# Patient Record
Sex: Female | Born: 1995 | Race: Black or African American | Hispanic: No | Marital: Single | Smoking: Never smoker
Health system: Southern US, Community
[De-identification: ages and names within clinical notes are randomized; demographics above are authoritative.]

## PROBLEM LIST (undated history)

## (undated) ENCOUNTER — Ambulatory Visit (HOSPITAL_COMMUNITY): Admission: EM

## (undated) DIAGNOSIS — D649 Anemia, unspecified: Secondary | ICD-10-CM

## (undated) HISTORY — DX: Anemia, unspecified: D64.9

---
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
## 2017-10-25 ENCOUNTER — Encounter (HOSPITAL_COMMUNITY): Payer: Self-pay

## 2017-10-25 ENCOUNTER — Ambulatory Visit (HOSPITAL_COMMUNITY)
Admission: EM | Admit: 2017-10-25 | Discharge: 2017-10-25 | Disposition: A | Discharge status: Home / Self Care | Payer: Self-pay | Attending: Family Medicine | Admitting: Family Medicine

## 2017-10-25 ENCOUNTER — Other Ambulatory Visit: Payer: Self-pay

## 2017-10-25 DIAGNOSIS — H9201 Otalgia, right ear: Secondary | ICD-10-CM | POA: Insufficient documentation

## 2017-10-25 DIAGNOSIS — J039 Acute tonsillitis, unspecified: Secondary | ICD-10-CM

## 2017-10-25 DIAGNOSIS — Z79899 Other long term (current) drug therapy: Secondary | ICD-10-CM | POA: Insufficient documentation

## 2017-10-25 LAB — POCT RAPID STREP A: STREPTOCOCCUS, GROUP A SCREEN (DIRECT): NEGATIVE

## 2017-10-25 MED ORDER — IBUPROFEN 100 MG/5ML PO SUSP: 800.0000 mg | Freq: Three times a day (TID) | ORAL | 0 refills | Status: DC | PRN

## 2017-10-25 MED ORDER — AMOXICILLIN 500 MG PO CAPS: 500.0000 mg | ORAL_CAPSULE | Freq: Two times a day (BID) | ORAL | 0 refills | Status: AC

## 2017-10-25 NOTE — Discharge Instructions (Signed)
Throat lozenges, gargles, chloraseptic spray, warm teas, popsicles etc to help with throat pain.   Complete course of antibiotics.   Tylenol and/or ibuprofen as needed for pain or fevers.   If symptoms worsen or do not improve in the next week to return to be seen or to follow up with your PCP.

## 2017-10-25 NOTE — ED Provider Notes (Signed)
[place]    CSN: [id number] Arrival date & time: 10/25/17  1545     History   Chief Complaint Chief Complaint  Patient presents with  . Sore Throat    HPI Michele Fox is a 22 y.o. female.   Michele Fox presents with her mother with complaints of sore throat, worse on right side. Started 9/29 and has not improved. Subjective fevers. Causes right ear pain. Some cough and congestion as well but primarily sore throat. No known ill contacts. No gi/gu complaints. Has been unable to swallow any pills so hasn't taken any medications for symptoms. No rash. Denies any previous similar. Without contributing medical history.      ROS per HPI.      History reviewed. No pertinent past medical history.  There are no active problems to display for this patient.   History reviewed. No pertinent surgical history.  OB History   None      Home Medications    Prior to Admission medications   Medication Sig Start Date End Date Taking? Authorizing Provider  acetaminophen (TYLENOL) 500 MG tablet Take 500 mg by mouth every 4 (four) hours as needed.    [provider]  amoxicillin (AMOXIL) 500 MG capsule Take 1 capsule (500 mg total) by mouth 2 (two) times daily for 10 days. 10/25/17 11/04/17  Zigmund Gottron, NP  ibuprofen (ADVIL,MOTRIN) 100 MG/5ML suspension Take 40 mLs (800 mg total) by mouth every 8 (eight) hours as needed for fever or mild pain. 10/25/17   Zigmund Gottron, NP  OVER THE COUNTER MEDICATION Take 10 mL/mL by mouth 2 (two) times daily. OTC.Cold & Fever Syrup    [provider]    Family History History reviewed. No pertinent family history.  Social History Social History   Tobacco Use  . Smoking status: Never Smoker  . Smokeless tobacco: Never Used  Substance Use Topics  . Alcohol use: Not on file  . Drug use: Not on file     Allergies   Other   Review of Systems Review of Systems   Physical Exam Triage Vital  Signs ED Triage Vitals  Enc Vitals Group     BP 10/25/17 1602 129/80     Pulse Rate 10/25/17 1602 81     Resp 10/25/17 1602 (!) 81     Temp 10/25/17 1602 99.6 F (37.6 C)     Temp src --      SpO2 10/25/17 1602 100 %     Weight 10/25/17 1556 258 lb (117 kg)     Height --      Head Circumference --      Peak Flow --      Pain Score --      Pain Loc --      Pain Edu? --      Excl. in [place]? --    No data found.  Updated Vital Signs BP 129/80 (BP Location: Right Arm)   Pulse 81   Temp 99.6 F (37.6 C)   Resp (!) 81   Wt 258 lb (117 kg)   LMP 10/08/2017   SpO2 100%    Physical Exam  Constitutional: She is oriented to person, place, and time. She appears well-developed and well-nourished. No distress.  HENT:  Head: Normocephalic and atraumatic.  Right Ear: Tympanic membrane, external ear and ear canal normal.  Left Ear: Tympanic membrane, external ear and ear canal normal.  Nose: Nose normal.  Mouth/Throat: Uvula is midline, oropharynx  is clear and moist and mucous membranes are normal. Tonsils are 3+ on the right. Tonsils are 2+ on the left. Tonsillar exudate.  Eyes: Pupils are equal, round, and reactive to light. Conjunctivae and EOM are normal.  Cardiovascular: Normal rate, regular rhythm and normal heart sounds.  Pulmonary/Chest: Effort normal and breath sounds normal.  Lymphadenopathy:    She has cervical adenopathy.  Neurological: She is alert and oriented to person, place, and time.  Skin: Skin is warm and dry.     UC Treatments / Results  Labs (all labs ordered are listed, but only abnormal results are displayed) Labs Reviewed  CULTURE, GROUP A STREP [facility])  POCT RAPID STREP A    EKG None  Radiology No results found.  Procedures Procedures (including critical care time)  Medications Ordered in UC Medications - No data to display  Initial Impression / Assessment and Plan / UC Course  I have reviewed the triage vital signs and the nursing  notes.  Pertinent labs & imaging results that were available during my care of the patient were reviewed by me and considered in my medical decision making (see chart for details).     Negative rapid strep. Temp 99.6 here in clinic, impressive tonsillar exam with adenopathy as well, will treat tonsillitis with amoxacillin at this time. If symptoms worsen or do not improve in the next week to return to be seen or to follow up with PCP.  Patient verbalized understanding and agreeable to plan.   Final Clinical Impressions(s) / UC Diagnoses   Final diagnoses:  Tonsillitis     Discharge Instructions     Throat lozenges, gargles, chloraseptic spray, warm teas, popsicles etc to help with throat pain.   Complete course of antibiotics.   Tylenol and/or ibuprofen as needed for pain or fevers.   If symptoms worsen or do not improve in the next week to return to be seen or to follow up with your PCP.     ED Prescriptions    Medication Sig Dispense Auth. Provider   amoxicillin (AMOXIL) 500 MG capsule Take 1 capsule (500 mg total) by mouth 2 (two) times daily for 10 days. 20 capsule Augusto Gamble B, NP   ibuprofen (ADVIL,MOTRIN) 100 MG/5ML suspension Take 40 mLs (800 mg total) by mouth every 8 (eight) hours as needed for fever or mild pain. 473 mL Augusto Gamble B, NP     Controlled Substance Prescriptions [place] Controlled Substance Registry consulted? Not Applicable   Zigmund Gottron, NP 10/25/17 1646

## 2017-10-25 NOTE — ED Triage Notes (Signed)
Pt states she has a sore throat x 5 days

## 2017-10-28 LAB — CULTURE, GROUP A STREP (THRC)

## 2017-11-30 ENCOUNTER — Telehealth (HOSPITAL_COMMUNITY): Payer: Self-pay | Admitting: Emergency Medicine

## 2017-11-30 NOTE — Telephone Encounter (Signed)
Pt calling asking for refill of amoxicillin, stsates her tonsils are swollen again. Per traci, will send in prednisone trial and if that doesn't work pt needs eval with ENT or ER.

## 2018-01-23 NOTE — L&D Delivery Note (Addendum)
Delivery Note At 12:14 PM a viable female was delivered via Vaginal, Spontaneous (Presentation: Vertex; ROA).  APGAR: 6, 8; weight see delivery summary.   Placenta status: Spontaneous, intact with the following complications: Cord: Delayed cord clamp, 3 vessels with the following complications: none.   Anesthesia:  Epidural Episiotomy: None Lacerations: 2nd degree;Perineal Suture Repair: 3.0 vicryl x 2 Est. Blood Loss (mL):  8144  Complications:  PPH: Pitocin was given after delivery of the infant. Uterine prolapse with placental expulsion (Dr. Arlina Robes called to the room). Submucosal fibroid found on exam. Bimanual massage applied. Fundal massage performed. Placental tissue x2 removed. Perineal laceration was repaired. Methergine 0.2 mg/ml, TXA, 827mcg rectal cytotec given. Patient was hemostatic 30 minutes after delivery of the placenta. CBC and DIC panel was collected during care.  Mom to postpartum.  Baby to Couplet care / Skin to Skin.  Gerlene Fee, DO Family Medicine Resident 08/12/2018, 2:00 PM  OB Attending CTSP following delivery by Derrill Memo. Prolapse uterine fibroid with probable uterine inversion. Had been replaced by Derrill Memo. Uterine exploration noted @ 9 cm submucosal fibroid with fragments of membranes. Fragments removed. Pt given Cytotec rectal, TXA and Methergine. Good response to bleeding noted. Uterus firm. Pt did become hypotensive with emesis. Anesthesia notified. Pt treated with IV fluids and Albumin. Good response to these measures noted. Labs drawn as noted above. Mother and infant recovering in room together.  Arlina Robes, MD

## 2018-02-18 ENCOUNTER — Encounter (HOSPITAL_COMMUNITY): Payer: Self-pay

## 2018-02-18 ENCOUNTER — Other Ambulatory Visit (HOSPITAL_COMMUNITY): Payer: Self-pay | Admitting: Family Medicine

## 2018-02-18 DIAGNOSIS — Z369 Encounter for antenatal screening, unspecified: Secondary | ICD-10-CM

## 2018-02-19 ENCOUNTER — Ambulatory Visit (HOSPITAL_COMMUNITY)
Admission: RE | Admit: 2018-02-19 | Discharge: 2018-02-19 | Disposition: A | Discharge status: Home / Self Care | Payer: Medicaid Other | Source: Ambulatory Visit | Attending: Family | Admitting: Family

## 2018-02-19 ENCOUNTER — Encounter (HOSPITAL_COMMUNITY): Payer: Self-pay

## 2018-02-19 ENCOUNTER — Other Ambulatory Visit (HOSPITAL_COMMUNITY): Payer: Self-pay

## 2018-08-12 ENCOUNTER — Inpatient Hospital Stay (HOSPITAL_COMMUNITY): Payer: Medicaid Other

## 2018-08-12 ENCOUNTER — Other Ambulatory Visit: Payer: Self-pay

## 2018-08-12 ENCOUNTER — Inpatient Hospital Stay (HOSPITAL_COMMUNITY)
Admission: AD | Admit: 2018-08-12 | Discharge: 2018-08-14 | DRG: 806 | Disposition: A | Discharge status: Home / Self Care | Payer: Medicaid Other | Attending: Obstetrics and Gynecology | Admitting: Obstetrics and Gynecology

## 2018-08-12 ENCOUNTER — Encounter (HOSPITAL_COMMUNITY): Payer: Self-pay

## 2018-08-12 ENCOUNTER — Inpatient Hospital Stay (HOSPITAL_COMMUNITY): Payer: Medicaid Other | Admitting: Anesthesiology

## 2018-08-12 DIAGNOSIS — O26893 Other specified pregnancy related conditions, third trimester: Secondary | ICD-10-CM | POA: Diagnosis present

## 2018-08-12 DIAGNOSIS — O99824 Streptococcus B carrier state complicating childbirth: Secondary | ICD-10-CM | POA: Diagnosis present

## 2018-08-12 DIAGNOSIS — D250 Submucous leiomyoma of uterus: Secondary | ICD-10-CM | POA: Diagnosis present

## 2018-08-12 DIAGNOSIS — O9902 Anemia complicating childbirth: Secondary | ICD-10-CM | POA: Diagnosis present

## 2018-08-12 DIAGNOSIS — Z3A38 38 weeks gestation of pregnancy: Secondary | ICD-10-CM | POA: Diagnosis not present

## 2018-08-12 DIAGNOSIS — O3413 Maternal care for benign tumor of corpus uteri, third trimester: Secondary | ICD-10-CM | POA: Diagnosis present

## 2018-08-12 DIAGNOSIS — N812 Incomplete uterovaginal prolapse: Secondary | ICD-10-CM

## 2018-08-12 DIAGNOSIS — O99214 Obesity complicating childbirth: Secondary | ICD-10-CM | POA: Diagnosis present

## 2018-08-12 DIAGNOSIS — Z1159 Encounter for screening for other viral diseases: Secondary | ICD-10-CM

## 2018-08-12 DIAGNOSIS — D649 Anemia, unspecified: Secondary | ICD-10-CM | POA: Diagnosis present

## 2018-08-12 DIAGNOSIS — B951 Streptococcus, group B, as the cause of diseases classified elsewhere: Secondary | ICD-10-CM

## 2018-08-12 DIAGNOSIS — R0602 Shortness of breath: Secondary | ICD-10-CM

## 2018-08-12 DIAGNOSIS — O34523 Maternal care for prolapse of gravid uterus, third trimester: Principal | ICD-10-CM | POA: Diagnosis present

## 2018-08-12 HISTORY — DX: Incomplete uterovaginal prolapse: N81.2

## 2018-08-12 HISTORY — DX: Streptococcus, group b, as the cause of diseases classified elsewhere: B95.1

## 2018-08-12 LAB — CBC
HCT: 32.8 % — ABNORMAL LOW (ref 36.0–46.0)
HCT: 25.5 % — ABNORMAL LOW (ref 36.0–46.0)
Hemoglobin: 11 g/dL — ABNORMAL LOW (ref 12.0–15.0)
Hemoglobin: 8.6 g/dL — ABNORMAL LOW (ref 12.0–15.0)
MCH: 31.2 pg (ref 26.0–34.0)
MCH: 31 pg (ref 26.0–34.0)
MCHC: 33.5 g/dL (ref 30.0–36.0)
MCHC: 33.7 g/dL (ref 30.0–36.0)
MCV: 92.9 fL (ref 80.0–100.0)
MCV: 92.1 fL (ref 80.0–100.0)
Platelets: 192 10*3/uL (ref 150–400)
Platelets: 182 10*3/uL (ref 150–400)
RBC: 3.53 MIL/uL — ABNORMAL LOW (ref 3.87–5.11)
RBC: 2.77 MIL/uL — ABNORMAL LOW (ref 3.87–5.11)
RDW: 12.5 % (ref 11.5–15.5)
RDW: 12.1 % (ref 11.5–15.5)
WBC: 5.9 10*3/uL (ref 4.0–10.5)
WBC: 4.1 10*3/uL (ref 4.0–10.5)
nRBC: 0 % (ref 0.0–0.2)
nRBC: 0 % (ref 0.0–0.2)

## 2018-08-12 LAB — PROTIME-INR
INR: 1.2 (ref 0.8–1.2)
Prothrombin Time: 14.7 seconds (ref 11.4–15.2)

## 2018-08-12 LAB — COMPREHENSIVE METABOLIC PANEL
ALT: 12 U/L (ref 0–44)
AST: 15 U/L (ref 15–41)
Albumin: 2.8 g/dL — ABNORMAL LOW (ref 3.5–5.0)
Alkaline Phosphatase: 97 U/L (ref 38–126)
Anion gap: 9 (ref 5–15)
BUN: <5 mg/dL — ABNORMAL LOW (ref 6–20)
CO2: 22 mmol/L (ref 22–32)
Calcium: 8.6 mg/dL — ABNORMAL LOW (ref 8.9–10.3)
Chloride: 106 mmol/L (ref 98–111)
Creatinine, Ser: 0.68 mg/dL (ref 0.44–1.00)
GFR calc Af Amer: >60 mL/min (ref >60)
GFR calc non Af Amer: >60 mL/min (ref >60)
Glucose, Bld: 87 mg/dL (ref 70–99)
Potassium: 3.6 mmol/L (ref 3.5–5.1)
Sodium: 137 mmol/L (ref 135–145)
Total Bilirubin: 0.8 mg/dL (ref 0.3–1.2)
Total Protein: 6.5 g/dL (ref 6.5–8.1)

## 2018-08-12 LAB — RPR: RPR Ser Ql: NONREACTIVE

## 2018-08-12 LAB — HEMOGLOBIN AND HEMATOCRIT, BLOOD
HCT: 24.4 % — ABNORMAL LOW (ref 36.0–46.0)
Hemoglobin: 8.6 g/dL — ABNORMAL LOW (ref 12.0–15.0)

## 2018-08-12 LAB — PROTEIN / CREATININE RATIO, URINE
Creatinine, Urine: 191.4 mg/dL
Protein Creatinine Ratio: 0.13 mg/mg{Cre} (ref 0.00–0.15)
Total Protein, Urine: 25 mg/dL

## 2018-08-12 LAB — APTT: aPTT: 30 seconds (ref 24–36)

## 2018-08-12 LAB — SARS CORONAVIRUS 2 BY RT PCR (HOSPITAL ORDER, PERFORMED IN ~~LOC~~ HOSPITAL LAB): SARS Coronavirus 2: NEGATIVE

## 2018-08-12 LAB — PREPARE RBC (CROSSMATCH)

## 2018-08-12 LAB — FIBRINOGEN: Fibrinogen: 410 mg/dL (ref 210–475)

## 2018-08-12 LAB — SAVE SMEAR(SSMR), FOR PROVIDER SLIDE REVIEW

## 2018-08-12 LAB — ABO/RH: ABO/RH(D): A POS

## 2018-08-12 MED ORDER — SENNOSIDES-DOCUSATE SODIUM 8.6-50 MG PO TABS
2.0000 | ORAL_TABLET | ORAL | Status: DC
  Administered 2018-08-13 – 2018-08-14 (×2): 2 via ORAL
  Filled 2018-08-12 (×2): qty 2

## 2018-08-12 MED ORDER — MISOPROSTOL 200 MCG PO TABS
800.0000 ug | ORAL_TABLET | Freq: Once | ORAL | Status: AC
  Administered 2018-08-12: 800 ug via RECTAL
  Filled 2018-08-12: qty 4

## 2018-08-12 MED ORDER — ALBUMIN HUMAN 5 % IV SOLN
12.5000 g | INTRAVENOUS | Status: AC
  Administered 2018-08-12: 12.5 g via INTRAVENOUS
  Filled 2018-08-12: qty 250

## 2018-08-12 MED ORDER — EPHEDRINE 5 MG/ML INJ
10.0000 mg | INTRAVENOUS | Status: DC | PRN
  Filled 2018-08-12: qty 2

## 2018-08-12 MED ORDER — ONDANSETRON HCL 4 MG/2ML IJ SOLN
4.0000 mg | Freq: Four times a day (QID) | INTRAMUSCULAR | Status: DC | PRN
  Administered 2018-08-12: 14:00:00 4 mg via INTRAVENOUS
  Filled 2018-08-12: qty 2

## 2018-08-12 MED ORDER — IBUPROFEN 600 MG PO TABS
600.0000 mg | ORAL_TABLET | Freq: Four times a day (QID) | ORAL | Status: DC
  Administered 2018-08-12 – 2018-08-14 (×8): 600 mg via ORAL
  Filled 2018-08-12 (×8): qty 1

## 2018-08-12 MED ORDER — TRANEXAMIC ACID-NACL 1000-0.7 MG/100ML-% IV SOLN
INTRAVENOUS | Status: AC
  Administered 2018-08-12: 1000 mg
  Filled 2018-08-12: qty 100

## 2018-08-12 MED ORDER — PRENATAL MULTIVITAMIN CH
1.0000 | ORAL_TABLET | Freq: Every day | ORAL | Status: DC
  Administered 2018-08-13 – 2018-08-14 (×2): 1 via ORAL
  Filled 2018-08-12 (×2): qty 1

## 2018-08-12 MED ORDER — PHENYLEPHRINE 40 MCG/ML (10ML) SYRINGE FOR IV PUSH (FOR BLOOD PRESSURE SUPPORT)
80.0000 ug | PREFILLED_SYRINGE | INTRAVENOUS | Status: DC | PRN
  Administered 2018-08-12: 20 ug via INTRAVENOUS
  Filled 2018-08-12: qty 10

## 2018-08-12 MED ORDER — PENICILLIN G 3 MILLION UNITS IVPB - SIMPLE MED
3.0000 10*6.[IU] | INTRAVENOUS | Status: DC
  Administered 2018-08-12 (×2): 3 10*6.[IU] via INTRAVENOUS
  Filled 2018-08-12 (×6): qty 100

## 2018-08-12 MED ORDER — ONDANSETRON HCL 4 MG/2ML IJ SOLN: 4.0000 mg | INTRAMUSCULAR | Status: DC | PRN

## 2018-08-12 MED ORDER — OXYCODONE-ACETAMINOPHEN 5-325 MG PO TABS: 1.0000 | ORAL_TABLET | ORAL | Status: DC | PRN

## 2018-08-12 MED ORDER — LIDOCAINE HCL (PF) 1 % IJ SOLN: 30.0000 mL | INTRAMUSCULAR | Status: DC | PRN

## 2018-08-12 MED ORDER — SIMETHICONE 80 MG PO CHEW: 80.0000 mg | CHEWABLE_TABLET | ORAL | Status: DC | PRN

## 2018-08-12 MED ORDER — BENZOCAINE-MENTHOL 20-0.5 % EX AERO: 1.0000 "application " | INHALATION_SPRAY | CUTANEOUS | Status: DC | PRN

## 2018-08-12 MED ORDER — COCONUT OIL OIL: 1.0000 "application " | TOPICAL_OIL | Status: DC | PRN

## 2018-08-12 MED ORDER — SODIUM CHLORIDE (PF) 0.9 % IJ SOLN
INTRAMUSCULAR | Status: DC | PRN
  Administered 2018-08-12: 12 mL/h via EPIDURAL

## 2018-08-12 MED ORDER — SODIUM CHLORIDE 0.9% IV SOLUTION
Freq: Once | INTRAVENOUS | Status: AC
  Administered 2018-08-12: 13:00:00 via INTRAVENOUS

## 2018-08-12 MED ORDER — ALBUMIN HUMAN 5 % IV SOLN
INTRAVENOUS | Status: AC
  Filled 2018-08-12: qty 250

## 2018-08-12 MED ORDER — METHYLERGONOVINE MALEATE 0.2 MG/ML IJ SOLN
0.2000 mg | Freq: Once | INTRAMUSCULAR | Status: AC
  Administered 2018-08-12: 0.2 mg via INTRAMUSCULAR

## 2018-08-12 MED ORDER — LACTATED RINGERS IV SOLN: 500.0000 mL | Freq: Once | INTRAVENOUS | Status: DC

## 2018-08-12 MED ORDER — FUROSEMIDE 10 MG/ML IJ SOLN
10.0000 mg | Freq: Once | INTRAMUSCULAR | Status: AC
  Administered 2018-08-12: 10 mg via INTRAVENOUS
  Filled 2018-08-12: qty 2

## 2018-08-12 MED ORDER — ONDANSETRON HCL 4 MG PO TABS: 4.0000 mg | ORAL_TABLET | ORAL | Status: DC | PRN

## 2018-08-12 MED ORDER — SODIUM CHLORIDE 0.9 % IV SOLN
5.0000 10*6.[IU] | Freq: Once | INTRAVENOUS | Status: AC
  Administered 2018-08-12: 5 10*6.[IU] via INTRAVENOUS
  Filled 2018-08-12: qty 5

## 2018-08-12 MED ORDER — LACTATED RINGERS IV SOLN
INTRAVENOUS | Status: DC
  Administered 2018-08-13 (×2): via INTRAVENOUS

## 2018-08-12 MED ORDER — OXYTOCIN BOLUS FROM INFUSION
500.0000 mL | Freq: Once | INTRAVENOUS | Status: AC
  Administered 2018-08-12: 1000 mL via INTRAVENOUS

## 2018-08-12 MED ORDER — FLEET ENEMA 7-19 GM/118ML RE ENEM: 1.0000 | ENEMA | RECTAL | Status: DC | PRN

## 2018-08-12 MED ORDER — METHYLERGONOVINE MALEATE 0.2 MG/ML IJ SOLN
INTRAMUSCULAR | Status: AC
  Filled 2018-08-12: qty 1

## 2018-08-12 MED ORDER — LIDOCAINE HCL (PF) 1 % IJ SOLN
INTRAMUSCULAR | Status: DC | PRN
  Administered 2018-08-12 (×2): 4 mL via EPIDURAL

## 2018-08-12 MED ORDER — SOD CITRATE-CITRIC ACID 500-334 MG/5ML PO SOLN: 30.0000 mL | ORAL | Status: DC | PRN

## 2018-08-12 MED ORDER — HYDROMORPHONE HCL 1 MG/ML IJ SOLN
1.0000 mg | Freq: Once | INTRAMUSCULAR | Status: AC
  Administered 2018-08-12: 1 mg via INTRAVENOUS
  Filled 2018-08-12: qty 1

## 2018-08-12 MED ORDER — PHENYLEPHRINE 40 MCG/ML (10ML) SYRINGE FOR IV PUSH (FOR BLOOD PRESSURE SUPPORT)
80.0000 ug | PREFILLED_SYRINGE | INTRAVENOUS | Status: DC | PRN
  Filled 2018-08-12 (×3): qty 10

## 2018-08-12 MED ORDER — OXYTOCIN 40 UNITS IN NORMAL SALINE INFUSION - SIMPLE MED
2.5000 [IU]/h | INTRAVENOUS | Status: DC
  Administered 2018-08-12: 2.5 [IU]/h via INTRAVENOUS
  Filled 2018-08-12 (×2): qty 1000

## 2018-08-12 MED ORDER — MISOPROSTOL 200 MCG PO TABS
800.0000 ug | ORAL_TABLET | Freq: Once | ORAL | Status: AC
  Administered 2018-08-12: 800 ug via RECTAL

## 2018-08-12 MED ORDER — OXYCODONE-ACETAMINOPHEN 5-325 MG PO TABS
1.0000 | ORAL_TABLET | ORAL | Status: DC | PRN
  Administered 2018-08-12 – 2018-08-13 (×4): 1 via ORAL
  Filled 2018-08-12 (×4): qty 1

## 2018-08-12 MED ORDER — FENTANYL CITRATE (PF) 100 MCG/2ML IJ SOLN
100.0000 ug | INTRAMUSCULAR | Status: DC | PRN
  Administered 2018-08-12 (×3): 100 ug via INTRAVENOUS
  Filled 2018-08-12 (×2): qty 2

## 2018-08-12 MED ORDER — LACTATED RINGERS IV SOLN
INTRAVENOUS | Status: DC
  Administered 2018-08-12 (×3): via INTRAVENOUS

## 2018-08-12 MED ORDER — OXYCODONE HCL 5 MG PO TABS
5.0000 mg | ORAL_TABLET | ORAL | Status: DC | PRN
  Administered 2018-08-12: 5 mg via ORAL
  Filled 2018-08-12 (×2): qty 1

## 2018-08-12 MED ORDER — TETANUS-DIPHTH-ACELL PERTUSSIS 5-2.5-18.5 LF-MCG/0.5 IM SUSP: 0.5000 mL | Freq: Once | INTRAMUSCULAR | Status: DC

## 2018-08-12 MED ORDER — MISOPROSTOL 200 MCG PO TABS: 800.0000 ug | ORAL_TABLET | Freq: Once | ORAL | Status: DC

## 2018-08-12 MED ORDER — DIPHENHYDRAMINE HCL 50 MG/ML IJ SOLN: 12.5000 mg | INTRAMUSCULAR | Status: DC | PRN

## 2018-08-12 MED ORDER — ZOLPIDEM TARTRATE 5 MG PO TABS: 5.0000 mg | ORAL_TABLET | Freq: Every evening | ORAL | Status: DC | PRN

## 2018-08-12 MED ORDER — ACETAMINOPHEN 325 MG PO TABS
650.0000 mg | ORAL_TABLET | ORAL | Status: DC | PRN
  Administered 2018-08-12: 650 mg via ORAL
  Filled 2018-08-12: qty 2

## 2018-08-12 MED ORDER — FENTANYL-BUPIVACAINE-NACL 0.5-0.125-0.9 MG/250ML-% EP SOLN
12.0000 mL/h | EPIDURAL | Status: DC | PRN
  Filled 2018-08-12: qty 250

## 2018-08-12 MED ORDER — WITCH HAZEL-GLYCERIN EX PADS: 1.0000 "application " | MEDICATED_PAD | CUTANEOUS | Status: DC | PRN

## 2018-08-12 MED ORDER — DIPHENHYDRAMINE HCL 25 MG PO CAPS: 25.0000 mg | ORAL_CAPSULE | Freq: Four times a day (QID) | ORAL | Status: DC | PRN

## 2018-08-12 MED ORDER — METHYLERGONOVINE MALEATE 0.2 MG PO TABS
0.2000 mg | ORAL_TABLET | ORAL | Status: AC
  Administered 2018-08-12 – 2018-08-13 (×5): 0.2 mg via ORAL
  Filled 2018-08-12 (×5): qty 1

## 2018-08-12 MED ORDER — FENTANYL CITRATE (PF) 100 MCG/2ML IJ SOLN
INTRAMUSCULAR | Status: AC
  Filled 2018-08-12: qty 2

## 2018-08-12 MED ORDER — LACTATED RINGERS IV BOLUS: 1000.0000 mL | Freq: Once | INTRAVENOUS | Status: DC

## 2018-08-12 MED ORDER — ACETAMINOPHEN 325 MG PO TABS: 650.0000 mg | ORAL_TABLET | ORAL | Status: DC | PRN

## 2018-08-12 MED ORDER — MISOPROSTOL 200 MCG PO TABS
ORAL_TABLET | ORAL | Status: AC
  Filled 2018-08-12: qty 4

## 2018-08-12 MED ORDER — TRANEXAMIC ACID-NACL 1000-0.7 MG/100ML-% IV SOLN: 1000.0000 mg | INTRAVENOUS | Status: DC

## 2018-08-12 MED ORDER — OXYCODONE-ACETAMINOPHEN 5-325 MG PO TABS: 2.0000 | ORAL_TABLET | ORAL | Status: DC | PRN

## 2018-08-12 MED ORDER — LACTATED RINGERS IV SOLN
500.0000 mL | INTRAVENOUS | Status: DC | PRN
  Administered 2018-08-12: 1000 mL via INTRAVENOUS
  Administered 2018-08-12: 500 mL via INTRAVENOUS

## 2018-08-12 MED ORDER — CEFAZOLIN SODIUM-DEXTROSE 1-4 GM/50ML-% IV SOLN
1.0000 g | Freq: Three times a day (TID) | INTRAVENOUS | Status: AC
  Administered 2018-08-12 – 2018-08-13 (×3): 1 g via INTRAVENOUS
  Filled 2018-08-12 (×4): qty 50

## 2018-08-12 MED ORDER — DIBUCAINE (PERIANAL) 1 % EX OINT: 1.0000 "application " | TOPICAL_OINTMENT | CUTANEOUS | Status: DC | PRN

## 2018-08-12 NOTE — Progress Notes (Signed)
Blood pressure cuff removed for blood draw

## 2018-08-12 NOTE — Anesthesia Preprocedure Evaluation (Signed)
Anesthesia Evaluation    Airway Mallampati: III  TM Distance: >3 FB Neck ROM: Full    Dental no notable dental hx. (+) Teeth Intact   Pulmonary neg pulmonary ROS,    Pulmonary exam normal breath sounds clear to auscultation       Cardiovascular negative cardio ROS Normal cardiovascular exam Rhythm:Regular Rate:Normal     Neuro/Psych negative neurological ROS  negative psych ROS   GI/Hepatic Neg liver ROS, GERD  ,  Endo/Other  Morbid obesity  Renal/GU negative Renal ROS  negative genitourinary   Musculoskeletal   Abdominal   Peds  Hematology  (+) anemia ,   Anesthesia Other Findings   Reproductive/Obstetrics                             Anesthesia Physical Anesthesia Plan  ASA: III  Anesthesia Plan: Epidural   Post-op Pain Management:    Induction:   PONV Risk Score and Plan:   Airway Management Planned: Natural Airway  Additional Equipment:   Intra-op Plan:   Post-operative Plan:   Informed Consent: I have reviewed the patients History and Physical, chart, labs and discussed the procedure including the risks, benefits and alternatives for the proposed anesthesia with the patient or authorized representative who has indicated his/her understanding and acceptance.       Plan Discussed with: Anesthesiologist  Anesthesia Plan Comments: (Arrived in patient's room and patient anxious about the procedure. Decided she did not want an epidural. Procedure not performed at this time.)        Anesthesia Quick Evaluation

## 2018-08-12 NOTE — Progress Notes (Signed)
Dr Royce Macadamia in room

## 2018-08-12 NOTE — Lactation Note (Signed)
This note was copied from a baby's chart. Lactation Consultation Note  Patient Name: Michele Fox [id number] Date: 08/12/2018 Reason for consult: Initial assessment;Early term 37-38.6wks;Primapara;1st time breastfeeding;Other (Comment)(Mother had PPH)  P1 mother whose infant is now 70 hours old.  This is an ETI at 38+4 weeks.  Per MD note, mother had a [place] in Labor and Delivery and received 2 units of PRBCs.  She also had elevated BP, partial uterine prolapse and required an O2 rebreather to keep O2 saturations > 90%.  She had an extended stay in labor and delivery before being transferred to first floor.  Upon entry into the room, the RN informed me that mother was having " issues."  I immediately left the room and informed her that lactation would visit at another time.  It was apparent that mother needed further medical intervention.  I will leave a note for day shift lactation consultant to see this mother early tomorrow to visit and set up a DEBP if current RN does not get the opportunity to do this.     Maternal Data Formula Feeding for Exclusion: Yes Reason for exclusion: Mother's choice to formula and breast feed on admission  Feeding    LATCH Score                   Interventions    Lactation Tools Discussed/Used     Consult Status Consult Status: Follow-up Date: 08/13/18 Follow-up type: In-patient    Michele Fox R [name] 08/12/2018, 9:02 PM

## 2018-08-12 NOTE — Progress Notes (Signed)
Moderate meconium noted upon vaginal exam.  Prior to exam it was unknown to nurse that membranes had ruptured.  Upon inquiry it was determine that SROM happened at 0830

## 2018-08-12 NOTE — H&P (Signed)
OBSTETRIC ADMISSION HISTORY AND PHYSICAL  Michele Fox is a 23 y.o. female G1P0 with IUP at [redacted]w[redacted]d by LMP presenting for spontaneous onset of labor. She reports +FMs, No LOF, no VB, no blurry vision, headaches or peripheral edema, and RUQ pain.  She plans on breast and bottle feeding. She request Depo for birth control. She received her prenatal care at [place]: By LMP --->  Estimated Date of Delivery: 08/22/18  Prenatal History/Complications:  Past Medical History: History reviewed. No pertinent past medical history.  Past Surgical History: History reviewed. No pertinent surgical history.  Obstetrical History: OB History    Gravida  1   Para      Term      Preterm      AB      Living        SAB      TAB      Ectopic      Multiple      Live Births              Social History: Social History   Socioeconomic History  . Marital status: Single    Spouse name: Not on file  . Number of children: Not on file  . Years of education: Not on file  . Highest education level: Not on file  Occupational History  . Not on file  Social Needs  . Financial resource strain: Not on file  . Food insecurity    Worry: Not on file    Inability: Not on file  . Transportation needs    Medical: Not on file    Non-medical: Not on file  Tobacco Use  . Smoking status: Never Smoker  . Smokeless tobacco: Never Used  Substance and Sexual Activity  . Alcohol use: Not on file  . Drug use: Not on file  . Sexual activity: Not on file  Lifestyle  . Physical activity    Days per week: Not on file    Minutes per session: Not on file  . Stress: Not on file  Relationships  . Social Herbalist on phone: Not on file    Gets together: Not on file    Attends religious service: Not on file    Active member of club or organization: Not on file    Attends meetings of clubs or organizations: Not on file    Relationship status: Not on file  Other Topics Concern  .  Not on file  Social History Narrative  . Not on file    Family History: History reviewed. No pertinent family history.  Allergies: Allergies  Allergen Reactions  . Other Other (See Comments)    Peach fuzz    Medications Prior to Admission  Medication Sig Dispense Refill Last Dose  . acetaminophen (TYLENOL) 500 MG tablet Take 500 mg by mouth every 4 (four) hours as needed.     Marland Kitchen ibuprofen (ADVIL,MOTRIN) 100 MG/5ML suspension Take 40 mLs (800 mg total) by mouth every 8 (eight) hours as needed for fever or mild pain. 473 mL 0   . OVER THE COUNTER MEDICATION Take 10 mL/mL by mouth 2 (two) times daily. OTC.Cold & Fever Syrup        Review of Systems   All systems reviewed and negative except as stated in HPI  Blood pressure 138/72, pulse 74, temperature 98.4 F (36.9 C), resp. rate 18, height 5\' 4"  (1.626 m), weight 123.4 kg, last menstrual period 11/15/2017. General appearance:  alert, cooperative and no distress Lungs: clear to auscultation bilaterally Heart: regular rate and rhythm Abdomen: soft, non-tender; bowel sounds normal Pelvic: n/a Extremities: Homans sign is negative, no sign of DVT DTR's +2 Presentation: cephalic Fetal monitoringBaseline: 120 bpm, Variability: Good {> 6 bpm), Accelerations: Reactive and Decelerations: Absent Uterine activityFrequency: Every 3-5 minutes Dilation: 5.5 Effacement (%): 70 Station: -3 Exam by:: L. Cox, RN   Prenatal labs: See prenatal record ABO, Rh:   Antibody:   Rubella:   RPR:    HBsAg:    HIV:    GBS:      Prenatal Transfer Tool  Maternal Diabetes: No Genetic Screening: Normal Maternal Ultrasounds/Referrals: Normal Fetal Ultrasounds or other Referrals:  None Maternal Substance Abuse:  No Significant Maternal Medications:  None Significant Maternal Lab Results: Group B Strep positive  No results found for this or any previous visit (from the past 24 hour(s)).  Patient Active Problem List   Diagnosis Date Noted   . Normal labor 08/12/2018    Assessment/Plan:  Michele Fox is a 23 y.o. G1P0 at [redacted]w[redacted]d here for spontaneous onset of labor  #Labor: expectant management #Pain: Per patient request #FWB: Cat 1 #ID:  GBS positive, PCN prophylaxis #MOF: both #MOC: Depo #Circ:  outpatient  Wende Mott, CNM  08/12/2018, 4:25 AM

## 2018-08-12 NOTE — Progress Notes (Addendum)
Patient ID: Michele Fox, female   DOB: July 04, 1995, 23 y.o.   MRN: [id number]  In to see pt to introduce myself at 1000; she has rec'd a couple doses of Fentanyl with good relief; has also vomited but declines antiemetic; RN reports that after the medication and the vomiting that FHR has a prolonged decel that recovers spontaneously (nadir 70-80s x 3 mins total); coping well w/ ctx; pt and mother report some greenish/brownish liquid when she was having a BM approx an hr ago  BP 141/62, 128/52 FHR was 130s, +accels; after my exam it became difficult to trace and IFSE applied to see rate in the 70-80s; pt to H&K at which point IFSE became sketchy; back to supine for replacement to see FHR 110s- total time down was 11 mins; by this time Dr Rip Harbour is in the room and the pt has been consented to C/S *FHR continued to spontaneously recover to 130s with accels; there wasn't the usual tachycardia with decreased variability that typically follows a prolonged decel, so the time down may have been less than 11 mins Cx 7-8/C/vtx 0  IUP@38 .4wks Active labor SROM with MSF FHR brady with recovery  -Will continue to manage expectantly -Epidural has been recommended by Dr Rip Harbour and anesthesia with pt to make final decision -Hopeful for SVD  [facility] 08/12/2018

## 2018-08-12 NOTE — Discharge Summary (Signed)
Postpartum Discharge Summary     Patient Name: Michele Fox DOB: 07/19/1995 MRN: [id number]  Date of admission: 08/12/2018 Delivering Provider: Gerlene Fee   Date of discharge: 08/14/2018  Admitting diagnosis: 15.2WKS CTX, TRANSFER Intrauterine pregnancy: [redacted]w[redacted]d     Secondary diagnosis:  Active Problems:   Submucous uterine fibroid   Incomplete uterine prolapse   Vaginal delivery   Maternal anemia, with delivery   Group beta Strep positive   Postpartum hemorrhage  Additional problems: none     Discharge diagnosis: Term Pregnancy Delivered, Anemia, PPH and partial uterine prolapse                                                                                                Post partum procedures:blood transfusion x 2u PRBC; CXR   Augmentation: none  Complications: partial uterine prolapse; uterine fibroid diagnosis at delivery; [facility]  Hospital course:  Onset of Labor With Vaginal Delivery     23 y.o. yo G1P0 at [redacted]w[redacted]d was admitted in Latent Labor on 08/12/2018. She had an initial elevated  BP with neg pre-e labs and remained essentially normotensive during labor. Patient had a labor course remarkable for progressing spontaneously to complete and delivering with 3 pushes, however with delivery of placenta the uterus prolapsed approx 1/3 of the body above the cx. A uterine fibroid was noted during replacement. Pt had a PPH of 1395cc due to bleeding from atony and a lac- see Delivery Note for details. She received a blood tx of 2u PRBC due to an initial post delivery CBC with Hgb 8.6 (11.0 at admission). She was also requiring a non-rebreather mask to keep her O2 sats >90% and was given Lasix and a CXR showing no acute findings.   Membrane Rupture Time/Date: 8:30 AM ,08/12/2018   Intrapartum Procedures: Episiotomy: None [1]                                         Lacerations:  2nd degree [3];Perineal [11]  Patient had a delivery of a Viable infant. 08/12/2018  Information  for the patient's newborn:  Berneita, Sanagustin [[id number]]  Delivery Method: Vag-Spont     Pateint had a postpartum course remarkable for normal lochia and no fever.  She is ambulating, tolerating a regular diet, passing flatus, and urinating well. Patient is discharged home in stable condition on 08/14/2018     Magnesium Sulfate recieved: No BMZ received: No  Physical exam  Vitals:   08/13/18 2332 08/14/18 0006 08/14/18 0410 08/14/18 0807  BP: (!) 97/37 (!) 117/56 (!) 110/43 (!) 121/48  Pulse: 74 78 76 77  Resp:   18 18  Temp:   98.2 F (36.8 C) 98.4 F (36.9 C)  TempSrc:   Oral Oral  SpO2:   98% 100%  Weight:      Height:       General: alert, cooperative and no distress Lochia: appropriate Uterine Fundus: firm Incision: N/A DVT Evaluation: No evidence of DVT seen on physical exam. Labs: Lab  Results  Component Value Date   WBC 16.7 (H) 08/13/2018   HGB 7.8 (L) 08/13/2018   HCT 22.3 (L) 08/13/2018   MCV 88.5 08/13/2018   PLT 131 (L) 08/13/2018   CMP Latest Ref Rng & Units 08/13/2018  Glucose 70 - 99 mg/dL 99  BUN 6 - 20 mg/dL 8  Creatinine 0.44 - 1.00 mg/dL 0.67  Sodium 135 - 145 mmol/L 135  Potassium 3.5 - 5.1 mmol/L 3.9  Chloride 98 - 111 mmol/L 107  CO2 22 - 32 mmol/L 21(L)  Calcium 8.9 - 10.3 mg/dL 8.1(L)  Total Protein 6.5 - 8.1 g/dL -  Total Bilirubin 0.3 - 1.2 mg/dL -  Alkaline Phos 38 - 126 U/L -  AST 15 - 41 U/L -  ALT 0 - 44 U/L -    Discharge instruction: per After Visit Summary and "Baby and Me Booklet".  After visit meds:  Allergies as of 08/14/2018      Reactions   Other Other (See Comments)   Peach fuzz      Medication List    STOP taking these medications   acetaminophen 500 MG tablet Commonly known as: TYLENOL   ibuprofen 100 MG/5ML suspension Commonly known as: ADVIL Replaced by: ibuprofen 600 MG tablet   OVER THE COUNTER MEDICATION     TAKE these medications   ibuprofen 600 MG tablet Commonly known as: ADVIL Take 1  tablet (600 mg total) by mouth every 6 (six) hours. Replaces: ibuprofen 100 MG/5ML suspension   prenatal multivitamin Tabs tablet Take 1 tablet by mouth daily at 12 noon.       Diet: home with mother  Activity: Advance as tolerated. Pelvic rest for 6 weeks.   Outpatient follow up:4 week PP visit; will need pelvic U/S at 3 mos PP to eval uterine fibroid Follow up Appt:No future appointments. Follow up Visit: Follow-up Information    Department, [facility]. Call in 4 week(s).   Why: to schedule your postpartum visit for in 4 weeks. You will need a pelvic ultrasound in 3 months to evaluate the uterine fibroid that was noted at the time of delivery. Contact information: [place] [address] [phone]             Newborn Data: Live born female  Birth Weight: 2875 gm (6lb 5.4oz)   APGAR: 54, 8  Newborn Delivery   Birth date/time: 08/12/2018 12:14:00 Delivery type: Vaginal, Spontaneous      Baby Feeding: Bottle and Breast Disposition:home with mother   08/14/2018 Emeterio Reeve, MD

## 2018-08-12 NOTE — Progress Notes (Signed)
NICU requested

## 2018-08-12 NOTE — Progress Notes (Signed)
NICU arrived

## 2018-08-12 NOTE — Progress Notes (Signed)
Pt had emesis.  Soiling herself, gown and bed.  Pt up to bathroom to clean up

## 2018-08-12 NOTE — Progress Notes (Signed)
Pt throwing up

## 2018-08-12 NOTE — Progress Notes (Signed)
Pt sitting at present.  Pt was walking in room from 0945 to [phone]

## 2018-08-12 NOTE — Progress Notes (Signed)
Patient ID: Michele Fox, female   DOB: [birth year], 23 y.o.   MRN: [id number] CTSP after passing large blood clot and some vaginal bleeding afterwards. Aprox blood loss of 450 cc  Pt alert, c/o some pain.  PE AF VSS Good UOP Abd soft + BS uterus firm U-2  GU moderate bleeding noted  A/P DOD TSVD        PPH         Pt has completed her blood transfusion. Will check H & H now. Cytotec per rectum now. Continue with Methergine series.

## 2018-08-12 NOTE — MAU Note (Signed)
Pt having ctx since since yesterday. Reports ctx are stronger and closer together Stated her mucus plug came out earlier with some bloody show.  Good fetal movement felt.

## 2018-08-12 NOTE — Progress Notes (Signed)
Dr Royce Macadamia called and informed of pt's bp's, fibroid, bleeding and previous interventions and meds, requested to come to room

## 2018-08-12 NOTE — Anesthesia Procedure Notes (Signed)
Epidural Patient location during procedure: OB Start time: 08/12/2018 10:43 AM End time: 08/12/2018 10:51 AM  Staffing Anesthesiologist: Josephine Igo, MD Performed: anesthesiologist   Preanesthetic Checklist Completed: patient identified, site marked, surgical consent, pre-op evaluation, timeout performed, IV checked, risks and benefits discussed and monitors and equipment checked  Epidural Patient position: sitting Prep: site prepped and draped and DuraPrep Patient monitoring: continuous pulse ox and blood pressure Approach: midline Location: L3-L4 Injection technique: LOR air  Needle:  Needle type: Tuohy  Needle gauge: 17 G Needle length: 9 cm and 9 Needle insertion depth: 8 cm Catheter type: closed end flexible Catheter size: 19 Gauge Catheter at skin depth: 14 cm Test dose: negative and Other  Assessment Events: blood not aspirated, injection not painful, no injection resistance, negative IV test and no paresthesia  Additional Notes Patient identified. Risks and benefits discussed including failed block, incomplete  Pain control, post dural puncture headache, nerve damage, paralysis, blood pressure Changes, nausea, vomiting, reactions to medications-both toxic and allergic and post Partum back pain. All questions were answered. Patient expressed understanding and wished to proceed. Sterile technique was used throughout procedure. Epidural site was Dressed with sterile barrier dressing. No paresthesias, signs of intravascular injection Or signs of intrathecal spread were encountered.  Patient was more comfortable after the epidural was dosed. Please see RN's note for documentation of vital signs and FHR which are stable. Reason for block:procedure for pain

## 2018-08-13 LAB — TYPE AND SCREEN
ABO/RH(D): A POS
Antibody Screen: NEGATIVE
Unit division: 0
Unit division: 0

## 2018-08-13 LAB — BASIC METABOLIC PANEL
Anion gap: 7 (ref 5–15)
BUN: 8 mg/dL (ref 6–20)
CO2: 21 mmol/L — ABNORMAL LOW (ref 22–32)
Calcium: 8.1 mg/dL — ABNORMAL LOW (ref 8.9–10.3)
Chloride: 107 mmol/L (ref 98–111)
Creatinine, Ser: 0.67 mg/dL (ref 0.44–1.00)
GFR calc Af Amer: >60 mL/min (ref >60)
GFR calc non Af Amer: >60 mL/min (ref >60)
Glucose, Bld: 99 mg/dL (ref 70–99)
Potassium: 3.9 mmol/L (ref 3.5–5.1)
Sodium: 135 mmol/L (ref 135–145)

## 2018-08-13 LAB — CBC
HCT: 22.3 % — ABNORMAL LOW (ref 36.0–46.0)
Hemoglobin: 7.8 g/dL — ABNORMAL LOW (ref 12.0–15.0)
MCH: 31 pg (ref 26.0–34.0)
MCHC: 35 g/dL (ref 30.0–36.0)
MCV: 88.5 fL (ref 80.0–100.0)
Platelets: 131 10*3/uL — ABNORMAL LOW (ref 150–400)
RBC: 2.52 MIL/uL — ABNORMAL LOW (ref 3.87–5.11)
RDW: 13.4 % (ref 11.5–15.5)
WBC: 16.7 10*3/uL — ABNORMAL HIGH (ref 4.0–10.5)
nRBC: 0 % (ref 0.0–0.2)

## 2018-08-13 LAB — BPAM RBC
Blood Product Expiration Date: 202008052359
Blood Product Expiration Date: 202008052359
ISSUE DATE / TIME: 202007201455
ISSUE DATE / TIME: 202007201455
Unit Type and Rh: 6200
Unit Type and Rh: 6200

## 2018-08-13 MED ORDER — LACTATED RINGERS IV BOLUS
1000.0000 mL | Freq: Once | INTRAVENOUS | Status: AC
  Administered 2018-08-13: 1000 mL via INTRAVENOUS

## 2018-08-13 NOTE — Clinical Social Work Maternal (Addendum)
CLINICAL SOCIAL WORK MATERNAL/CHILD NOTE  Patient Details  Name: Michele Fox MRN: [id number] Date of Birth: 08/18/1995  Date:  08/13/2018  Clinical Social Worker Initiating Note:  Eulogio Requena, LCSW Date/Time: Initiated:  08/13/18/1110     Child's Name:  Jayce Lizak   Biological Parents:  Mother, Father(Father: Deandre Thomas 06-18-94)   Need for Interpreter:  None   Reason for Referral:  Current Substance Use/Substance Use During Pregnancy    Address:  [address] [place] [address]    Phone number:  [phone] (home)   Phone currently has no minutes;; Can be reached on mother's phone ([phone])  Additional phone number:   Household Members/Support Persons (HM/SP):   Household Member/Support Person 1   HM/SP Name Relationship DOB or Age  HM/SP -1 Michele Fox Mother    HM/SP -2        HM/SP -3        HM/SP -4        HM/SP -5        HM/SP -6        HM/SP -7        HM/SP -8          Natural Supports (not living in the home):  Other (Comment)(FOB's Mother and Family)   Professional Supports: None   Employment: Full-time   Type of Work: [employer]   Education:  Some College   Homebound arranged:    Financial Resources:  Medicaid   Other Resources:  Food Stamps , WIC   Cultural/Religious Considerations Which May Impact Care:    Strengths:  Home prepared for child , Pediatrician chosen, Ability to meet basic needs    Psychotropic Medications:         Pediatrician:    [place] area  Pediatrician List:   [facility]  [place]    [place]    [place]    [place]    [place]      Pediatrician Fax Number:    Risk Factors/Current Problems:  None   Cognitive State:  Able to Concentrate , Alert , Goal Oriented , Insightful , Linear Thinking    Mood/Affect:  Interested , Calm    CSW Assessment: CSW met with MOB at bedside to  discuss consult for substance use during pregnancy. MOB was engaged in skin to skin with infant. CSW introduced self and explained reason for consult. MOB was welcoming and engaged during assessment. MOB reported that she resides with her mother and works at [employer]. 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 CSW congratulated MOB on her promotion. MOB reported that she receives both WIC and Food Stamps. MOB reported that she has all items needed to care for infant including a car seat and basinet. CSW inquired about MOB's support system, MOB reported that her mother, family, FOB's mother and FOB's family are her supports. MOB reported that FOB is involved. CSW inquired about MOB's transportation needs, MOB reported that she plans to apply for Medicaid transportation, noting she has the number. MOB reported that she does not anticipate any transportation barriers with getting infant to pediatrician appointments.   CSW inquired about MOB's mental health history, MOB denied any mental health history. CSW inquired about how MOB was currently feeling, MOB reported that she was tired and happy. MOB reported that she is just happy to be holding infant.   MOB presented calm and did not demonstrate any acute mental health signs/symptoms. CSW assessed for safety, MOB denied SI, HI and domestic violence.   CSW provided education regarding the baby blues period vs. perinatal mood disorders, discussed treatment and gave resources for mental health follow up if concerns arise.  CSW recommends self-evaluation during the postpartum time period using the New Mom Checklist from Postpartum Progress and encouraged MOB to contact a medical professional if symptoms are noted at any time.    CSW provided review of Sudden Infant Death Syndrome (SIDS) precautions. MOB verbalized understanding and reported that infant would sleep in basinet.   CSW informed  MOB about hospital drug policy and inquired about substance use during pregnancy. MOB reported that she smoked marijuana and her last use was 4 months ago. MOB denied any other substance use and reported that she does not plan to continue smoking marijuana. CSW informed MOB that infant's UDS and CDS would continue to be monitored and a CPS report would be made if warranted. MOB verbalized understanding and asked appropriate questions.   CSW identifies no further need for intervention and no barriers to discharge at this time.  CSW Plan/Description:  No Further Intervention Required/No Barriers to Discharge, Sudden Infant Death Syndrome (SIDS) Education, Perinatal Mood and Anxiety Disorder (PMADs) Education, Hospital Drug Screen Policy Information, CSW Will Continue to Monitor Umbilical Cord Tissue Drug Screen Results and Make Report if Warranted    Ryan Ogborn L Abdou Stocks, LCSW 08/13/2018, 11:27 AM 

## 2018-08-13 NOTE — Anesthesia Postprocedure Evaluation (Signed)
Anesthesia Post Note  Patient: Dustine Bertini  Procedure(s) Performed: AN AD [place]     Patient location during evaluation: OB High Risk Anesthesia Type: Epidural Level of consciousness: awake and alert Pain management: pain level controlled Vital Signs Assessment: post-procedure vital signs reviewed and stable Respiratory status: spontaneous breathing, nonlabored ventilation and respiratory function stable Cardiovascular status: blood pressure returned to baseline and stable Postop Assessment: no headache, no backache and no apparent nausea or vomiting Anesthetic complications: no    Last Vitals:  Vitals:   08/13/18 0442 08/13/18 0800  BP: (!) 117/52 (!) 126/45  Pulse: 84 66  Resp: 18 18  Temp: 36.6 C 37 C  SpO2: 100% 100%    Last Pain:  Vitals:   08/13/18 0937  TempSrc:   PainSc: Asleep                 Cherylann Hobday A.

## 2018-08-13 NOTE — Progress Notes (Signed)
Post Partum Day 1 Subjective: no complaints  Objective: Blood pressure (!) 139/55, pulse 70, temperature 98.2 F (36.8 C), temperature source Oral, resp. rate 18, height 5\' 4"  (1.626 m), weight 123.4 kg, last menstrual period 11/15/2017, SpO2 100 %, unknown if currently breastfeeding.  Physical Exam:  General: alert, cooperative and no distress Lochia: appropriate Uterine Fundus: firm Incision: n/a DVT Evaluation: No evidence of DVT seen on physical exam.  Recent Labs    08/12/18 2211 08/13/18 0551  HGB 8.6* 7.8*  HCT 24.4* 22.3*    Assessment/Plan: Doing well, no heavy PP bleeding, tolerating activity   LOS: 1 day   Emeterio Reeve 08/13/2018, 4:01 PM

## 2018-08-13 NOTE — Lactation Note (Addendum)
This note was copied from a baby's chart. Lactation Consultation Note  Patient Name: Michele Fox YOVZC'H Date: 08/13/2018 Reason for consult: Initial assessment   Baby 49 hours old. PPH.  Mother waking after nap and getting ready to eat breakfast. LC set up DEBP and will return after mother eats to help with latch and use pump.  [name] returned to room. Mother resting with baby STS on her chest. Baby recently had bottle of formula. Reviewed how to use DEBP.  Mother has flat nipples.  Reviewed hand expression. Suggest calling when she needs assistance with latching. Grandmother asking questions regarding formula volume. LC reviewed volume guidelines increasing per day of life and as baby desires. Feed on demand approximately 8-12 times per day.       Maternal Data    Feeding Feeding Type: Bottle Fed - Formula Nipple Type: Slow - flow  LATCH Score                   Interventions Interventions: DEBP;Breast feeding basics reviewed  Lactation Tools Discussed/Used Pump Review: Setup, frequency, and cleaning;Milk Storage Initiated by:: Vivianne Master RN IBCLC Date initiated:: 08/13/18   Consult Status Consult Status: Follow-up Date: 08/13/18 Follow-up type: In-patient    Vivianne Master [facility] 08/13/2018, 10:07 AM

## 2018-08-13 NOTE — Progress Notes (Signed)
RN reported  [phone] CBC results to OB Anaesthesiologist. Epidural Catheter to be pulled by RN.

## 2018-08-14 MED ORDER — IBUPROFEN 600 MG PO TABS: 600.0000 mg | ORAL_TABLET | Freq: Four times a day (QID) | ORAL | 0 refills | Status: DC

## 2018-08-14 NOTE — Addendum Note (Signed)
Addendum  created 08/14/18 0816 by Asher Muir, CRNA   Charge Capture section accepted, Clinical Note Signed

## 2018-08-14 NOTE — Anesthesia Postprocedure Evaluation (Signed)
Anesthesia Post Note  Patient: Michele Fox  Procedure(s) Performed: AN AD [place]     Patient location during evaluation: Mother Baby Anesthesia Type: Epidural Level of consciousness: awake Pain management: satisfactory to patient Vital Signs Assessment: post-procedure vital signs reviewed and stable Respiratory status: spontaneous breathing Cardiovascular status: stable Anesthetic complications: no    Last Vitals:  Vitals:   08/14/18 0006 08/14/18 0410  BP: (!) 117/56 (!) 110/43  Pulse: 78 76  Resp:  18  Temp:  36.8 C  SpO2:  98%    Last Pain:  Vitals:   08/14/18 0730  TempSrc:   PainSc: 0-No pain   Pain Goal: Patients Stated Pain Goal: 2 (08/14/18 0730)              Epidural/Spinal Function Cutaneous sensation: Normal sensation (08/14/18 0730), Patient able to flex knees: Yes (08/14/18 0730), Patient able to lift hips off bed: Yes (08/14/18 0730), Back pain beyond tenderness at insertion site: No (08/14/18 0730), Progressively worsening motor and/or sensory loss: No (08/14/18 0730), Bowel and/or bladder incontinence post epidural: No (08/14/18 0730)  Casimer Lanius

## 2018-08-14 NOTE — Discharge Instructions (Signed)

## 2018-08-14 NOTE — Progress Notes (Signed)
Discharge teaching complete with pt. Pt understood all information and did not have any questions. Pt discharged home with family.  

## 2018-08-14 NOTE — Lactation Note (Signed)
This note was copied from a baby's chart. Lactation Consultation Note:  Mother bottle feeding infant when I arrived to room. Mother reports that she is leaking milk today.   Discussed supply and demand . Discussed importance of pumping if she wants to have milk come to volume.  Mother advised to start pumping for 15-20 mins every 2-3 hours.  Mother has a Advertising account executive pump at home.   Assist mother with hand expression and observed several drops of colostrum.  Mother was given a harmony hand pump and advised to pump 15 mins on each breast . Mother was also given shells. She has flat nipples, with areola edema and breast are starting to fill.  Discussed on treatment and prevention of engorgement.   Mother was grateful and receptive to teaching and plan. Mother is active with WIC, reveiwed [place] services at [facility].   Patient Name: Michele Fox POEUM'P Date: 08/14/2018 Reason for consult: Follow-up assessment   Maternal Data    Feeding Feeding Type: (bottle feeding when i arrived in room) Nipple Type: Slow - flow  LATCH Score                   Interventions Interventions: Hand express;Pre-pump if needed;Expressed milk;Shells;Hand pump  Lactation Tools Discussed/Used     Consult Status Consult Status: Complete    Darla Lesches 08/14/2018, 9:30 AM

## 2018-08-16 ENCOUNTER — Inpatient Hospital Stay (HOSPITAL_COMMUNITY): Payer: Medicaid Other

## 2018-08-16 ENCOUNTER — Inpatient Hospital Stay (HOSPITAL_COMMUNITY)
Admission: AD | Admit: 2018-08-16 | Discharge: 2018-08-19 | DRG: 769 | Disposition: A | Discharge status: Home / Self Care | Payer: Medicaid Other | Attending: Obstetrics and Gynecology | Admitting: Obstetrics and Gynecology

## 2018-08-16 DIAGNOSIS — N939 Abnormal uterine and vaginal bleeding, unspecified: Secondary | ICD-10-CM

## 2018-08-16 DIAGNOSIS — N855 Inversion of uterus: Secondary | ICD-10-CM | POA: Diagnosis not present

## 2018-08-16 DIAGNOSIS — O9081 Anemia of the puerperium: Secondary | ICD-10-CM | POA: Diagnosis present

## 2018-08-16 DIAGNOSIS — O99215 Obesity complicating the puerperium: Secondary | ICD-10-CM | POA: Diagnosis present

## 2018-08-16 DIAGNOSIS — O34599 Maternal care for other abnormalities of gravid uterus, unspecified trimester: Secondary | ICD-10-CM

## 2018-08-16 DIAGNOSIS — Z1159 Encounter for screening for other viral diseases: Secondary | ICD-10-CM | POA: Diagnosis not present

## 2018-08-16 HISTORY — DX: Maternal care for other abnormalities of gravid uterus, unspecified trimester: N85.5

## 2018-08-16 HISTORY — DX: Maternal care for other abnormalities of gravid uterus, unspecified trimester: O34.599

## 2018-08-16 LAB — COMPREHENSIVE METABOLIC PANEL
ALT: 15 U/L (ref 0–44)
AST: 18 U/L (ref 15–41)
Albumin: 2.6 g/dL — ABNORMAL LOW (ref 3.5–5.0)
Alkaline Phosphatase: 55 U/L (ref 38–126)
Anion gap: 7 (ref 5–15)
BUN: 10 mg/dL (ref 6–20)
CO2: 26 mmol/L (ref 22–32)
Calcium: 8.6 mg/dL — ABNORMAL LOW (ref 8.9–10.3)
Chloride: 111 mmol/L (ref 98–111)
Creatinine, Ser: 0.8 mg/dL (ref 0.44–1.00)
GFR calc Af Amer: >60 mL/min (ref >60)
GFR calc non Af Amer: >60 mL/min (ref >60)
Glucose, Bld: 85 mg/dL (ref 70–99)
Potassium: 3.9 mmol/L (ref 3.5–5.1)
Sodium: 144 mmol/L (ref 135–145)
Total Bilirubin: 0.3 mg/dL (ref 0.3–1.2)
Total Protein: 5.8 g/dL — ABNORMAL LOW (ref 6.5–8.1)

## 2018-08-16 LAB — CBC
HCT: 19 % — ABNORMAL LOW (ref 36.0–46.0)
HCT: 21.1 % — ABNORMAL LOW (ref 36.0–46.0)
Hemoglobin: 6.3 g/dL — CL (ref 12.0–15.0)
Hemoglobin: 6.9 g/dL — CL (ref 12.0–15.0)
MCH: 31.7 pg (ref 26.0–34.0)
MCH: 30.4 pg (ref 26.0–34.0)
MCHC: 33.2 g/dL (ref 30.0–36.0)
MCHC: 32.7 g/dL (ref 30.0–36.0)
MCV: 95.5 fL (ref 80.0–100.0)
MCV: 93 fL (ref 80.0–100.0)
Platelets: 210 10*3/uL (ref 150–400)
Platelets: 207 10*3/uL (ref 150–400)
RBC: 1.99 MIL/uL — ABNORMAL LOW (ref 3.87–5.11)
RBC: 2.27 MIL/uL — ABNORMAL LOW (ref 3.87–5.11)
RDW: 13.3 % (ref 11.5–15.5)
RDW: 13.2 % (ref 11.5–15.5)
WBC: 7.3 10*3/uL (ref 4.0–10.5)
WBC: 8.2 10*3/uL (ref 4.0–10.5)
nRBC: 0 % (ref 0.0–0.2)
nRBC: 0 % (ref 0.0–0.2)

## 2018-08-16 LAB — FIBRINOGEN: Fibrinogen: 371 mg/dL (ref 210–475)

## 2018-08-16 LAB — PREPARE RBC (CROSSMATCH)

## 2018-08-16 MED ORDER — FENTANYL CITRATE (PF) 100 MCG/2ML IJ SOLN
100.0000 ug | INTRAMUSCULAR | Status: AC
  Administered 2018-08-16: 12:00:00 100 ug via INTRAVENOUS

## 2018-08-16 MED ORDER — PRENATAL MULTIVITAMIN CH: 1.0000 | ORAL_TABLET | Freq: Every day | ORAL | Status: DC

## 2018-08-16 MED ORDER — KETOROLAC TROMETHAMINE 30 MG/ML IJ SOLN
30.0000 mg | Freq: Once | INTRAMUSCULAR | Status: AC
  Administered 2018-08-16: 30 mg via INTRAVENOUS

## 2018-08-16 MED ORDER — SODIUM CHLORIDE 0.9% IV SOLUTION
Freq: Once | INTRAVENOUS | Status: AC
  Administered 2018-08-16: 15:00:00 via INTRAVENOUS

## 2018-08-16 MED ORDER — SODIUM CHLORIDE 0.9 % IV SOLN
INTRAVENOUS | Status: DC
  Administered 2018-08-16: 22:00:00 via INTRAVENOUS

## 2018-08-16 MED ORDER — ACETAMINOPHEN 325 MG PO TABS: 650.0000 mg | ORAL_TABLET | ORAL | Status: DC | PRN

## 2018-08-16 MED ORDER — OXYCODONE-ACETAMINOPHEN 7.5-325 MG PO TABS
2.0000 | ORAL_TABLET | ORAL | Status: DC | PRN
  Administered 2018-08-16: 2 via ORAL
  Filled 2018-08-16: qty 2

## 2018-08-16 MED ORDER — LACTATED RINGERS IV SOLN
INTRAVENOUS | Status: DC
  Administered 2018-08-17: 09:00:00 via INTRAVENOUS

## 2018-08-16 MED ORDER — SODIUM CHLORIDE 0.9 % IV SOLN
1.0000 g | Freq: Two times a day (BID) | INTRAVENOUS | Status: DC
  Administered 2018-08-16 – 2018-08-17 (×3): 1 g via INTRAVENOUS
  Filled 2018-08-16 (×5): qty 1

## 2018-08-16 MED ORDER — LIDOCAINE HCL URETHRAL/MUCOSAL 2 % EX GEL: 1.0000 "application " | Freq: Once | CUTANEOUS | Status: DC

## 2018-08-16 MED ORDER — METOCLOPRAMIDE HCL 5 MG/ML IJ SOLN
10.0000 mg | Freq: Four times a day (QID) | INTRAMUSCULAR | Status: DC | PRN
  Administered 2018-08-16: 10 mg via INTRAVENOUS
  Filled 2018-08-16: qty 2

## 2018-08-16 MED ORDER — KETOROLAC TROMETHAMINE 60 MG/2ML IM SOLN
30.0000 mg | Freq: Once | INTRAMUSCULAR | Status: DC
  Filled 2018-08-16: qty 2

## 2018-08-16 MED ORDER — IOPAMIDOL (ISOVUE-300) INJECTION 61%
100.0000 mL | Freq: Once | INTRAVENOUS | Status: AC | PRN
  Administered 2018-08-16: 14:00:00 100 mL via INTRAVENOUS

## 2018-08-16 MED ORDER — FENTANYL CITRATE (PF) 100 MCG/2ML IJ SOLN
INTRAMUSCULAR | Status: AC
  Filled 2018-08-16: qty 2

## 2018-08-16 MED ORDER — SODIUM CHLORIDE 0.9% IV SOLUTION
Freq: Once | INTRAVENOUS | Status: AC
  Administered 2018-08-17: via INTRAVENOUS

## 2018-08-16 MED ORDER — KETOROLAC TROMETHAMINE 30 MG/ML IJ SOLN
INTRAMUSCULAR | Status: AC
  Filled 2018-08-16: qty 1

## 2018-08-16 MED ORDER — GADOBUTROL 1 MMOL/ML IV SOLN
10.0000 mL | Freq: Once | INTRAVENOUS | Status: AC | PRN
  Administered 2018-08-16: 10 mL via INTRAVENOUS

## 2018-08-16 MED ORDER — HYDROMORPHONE HCL 1 MG/ML IJ SOLN
1.0000 mg | INTRAMUSCULAR | Status: DC | PRN
  Administered 2018-08-16 – 2018-08-17 (×2): 1 mg via INTRAVENOUS
  Filled 2018-08-16 (×2): qty 1

## 2018-08-16 MED ORDER — LIDOCAINE HCL (CARDIAC) PF 100 MG/5ML IV SOSY
PREFILLED_SYRINGE | INTRAVENOUS | Status: AC
  Administered 2018-08-16: 100 mg
  Filled 2018-08-16: qty 5

## 2018-08-16 MED ORDER — KETOROLAC TROMETHAMINE 30 MG/ML IJ SOLN: 30.0000 mg | Freq: Once | INTRAMUSCULAR | Status: DC

## 2018-08-16 NOTE — MAU Note (Signed)
MAU provider notified of hemoglobin 6.3.

## 2018-08-16 NOTE — MAU Provider Note (Signed)
Patient Michele Fox is a 23 y.o. G1P1001 At 5 days postpartum here with complaints of heavy bleeding and feeling like something is "blocking" her from peeing. 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 See Inpatient notes for her complete hospital course.   She denies any complications in her pregnancy; she denies HA, blurry vision, floating spots, RUQ, diarrhea, NV. She is tearful because she misses her baby.   History     CSN: [id number]  Arrival date and time: 08/16/18 1121   First Provider Initiated Contact with Patient 08/16/18 1140      Chief Complaint  Patient presents with  . Vaginal Bleeding   Vaginal Bleeding The patient's primary symptoms include vaginal bleeding. This is a new problem. The problem has been gradually worsening. Pertinent negatives include no constipation or diarrhea. The vaginal discharge was bloody. The vaginal bleeding is heavier than menses. She has been passing clots. The symptoms are aggravated by urinating.  She says that she fills up two regular thick Kotex pads and two yellow pads.  She says that when she tries to sit and pee "nothing comes out" but the bleeding gets worse. Overall her bleeding is that same as when she came home; it has not increased in frequency except when she tries to pee--then it gets worse She has tried to feel inside her vagina and she reports something round and firm, but she cannot pull it out.  She denies any odor to her blood, or abdominal tenderness. She denies any tenderness over her bladder.   OB History    Gravida  1   Para  1   Term  1   Preterm      AB      Living  1     SAB      TAB      Ectopic      Multiple  0   Live Births  1           No past medical history on file.  No past surgical history on file.  No family history on file.  Social History   Tobacco Use  . Smoking status: Never Smoker  .  Smokeless tobacco: Never Used  Substance Use Topics  . Alcohol use: Not on file  . Drug use: Not on file    Allergies:  Allergies  Allergen Reactions  . Other Other (See Comments)    Peach fuzz    Medications Prior to Admission  Medication Sig Dispense Refill Last Dose  . ibuprofen (ADVIL) 600 MG tablet Take 1 tablet (600 mg total) by mouth every 6 (six) hours. 30 tablet 0 08/16/2018 at Unknown time  . Prenatal Vit-Fe Fumarate-FA (PRENATAL MULTIVITAMIN) TABS tablet Take 1 tablet by mouth daily at 12 noon.       Review of Systems  Constitutional: Negative.   HENT: Negative.   Respiratory: Negative.   Gastrointestinal: Negative for constipation and diarrhea.  Genitourinary: Positive for vaginal bleeding.  Neurological: Negative.   Psychiatric/Behavioral: Negative.    Physical Exam   Blood pressure (!) 143/64, pulse 82, temperature 98.8 F (37.1 C), temperature source Oral, resp. rate 18, SpO2 100 %, unknown if currently breastfeeding.  Physical Exam  Constitutional: She appears well-developed.  HENT:  Head: Normocephalic.  Neck: Normal range of motion.  Genitourinary:    Genitourinary Comments: NEFG; pernieal repair in the process of healing. On speculum exam, large clots removed with swab.  Unable to view the os. Instead, reddened fibrous tissues visible; active bleeding when touched with swab.      MAU Course  Procedures  MDM  Received call from EMS regarding patient's N-code. Provider immediately at the bedside once patient was in the room. Patient alert and oriented times 3; scan bleeding on pad. Able to answer questions and speak coherently. After my initial speculum exam, call placed to Dr. Roselie Awkward at 1211; he was at the bedside by 12:20. Foley cath at 12:20.  Dr. Hulan Fray at bedside by 1230; patient still with scant bleeding and alert and oriented times 3.  Decision was made to order CT with IV contrast due to concerns about kidney functions -Blood ordered at 1310; TYpe  and screen in process.   -Patient stable at this time; last ate at 10 am.  -Patient stable now at 1520; she is alert and oriented times 3; states that her bleeding is light.  -Blood infusion being started; Dr. Hulan Fray will call radiologist to discuss scan. Pelvic US and Abdominal US not ordered, instead will order MRI.  1620: MRI department states that MRI cannot be completed with blood infusing. RN spokes to Dr. Hulan Fray, who is aware of this delay. Per Dr. Hulan Fray, patient to finish her first unit  -Patient's bleeding is stable, patient is alert and oriented times 3; UA is appropriate (see I and 0) -Patient requesting discharge; Dr. Glo Herring at bedside.  -1800: Discussed patient's plan of care with Dr. Glo Herring patient will have MRI, then return to MAU for speculum exam.  -After speculum exam, will move patient to [facility] speciality care.   1857: Patient to MRI.  Assessment and Plan  -Patient care endorsed to Dr. Mallory Shirk at 2022; he will be at the bedside.   Mervyn Skeeters  08/16/2018, 11:48 AM

## 2018-08-16 NOTE — Progress Notes (Signed)
Pt. Arrived on OBSC. Plan to get IV ABX (note sent to pharmacy) and then continue with blood transfusion (2, 3, 4). Pt. Instructed to not eat/drink. Baby in room and pt's mother is watching baby. Explained that we will not be caring for baby but to still make sure to not sleep with baby and if needed lactation support can be offered. Patient and her mother stated understanding.

## 2018-08-16 NOTE — MAU Note (Signed)
PT delivered NSVD on Monday. Feels she  had heavy bleeding since she was discharged on Wednesday, using 2 large pads and 2 small pad per day. Feels something is "stuck" in her vagina which prevents her from urinating. Has not urinated since she came home on Wednesday. Denies fever, dizziness. Has had bowel movement.

## 2018-08-16 NOTE — MAU Note (Addendum)
Per MRI patient can not have IV PUMP. Spoke to Dr Hulan Fray who said to finish first unit of PRBC and then saline lock patient and send to MRI.  MRI tech aware of patient's small ear lobe piercing and said they would look at it when she gets there.

## 2018-08-17 ENCOUNTER — Inpatient Hospital Stay (HOSPITAL_COMMUNITY): Payer: Medicaid Other | Admitting: Certified Registered"

## 2018-08-17 ENCOUNTER — Encounter (HOSPITAL_COMMUNITY)
Admission: AD | Disposition: A | Discharge status: Home / Self Care | Payer: Self-pay | Source: Home / Self Care | Attending: Obstetrics and Gynecology

## 2018-08-17 ENCOUNTER — Encounter (HOSPITAL_COMMUNITY): Payer: Self-pay

## 2018-08-17 ENCOUNTER — Other Ambulatory Visit: Payer: Self-pay

## 2018-08-17 HISTORY — PX: UTERINE SEPTUM RESECTION: SHX5386

## 2018-08-17 LAB — CBC WITH DIFFERENTIAL/PLATELET
Abs Immature Granulocytes: 0.15 10*3/uL — ABNORMAL HIGH (ref 0.00–0.07)
Basophils Absolute: 0 10*3/uL (ref 0.0–0.1)
Basophils Relative: 0 %
Eosinophils Absolute: 0.1 10*3/uL (ref 0.0–0.5)
Eosinophils Relative: 2 %
HCT: 31.5 % — ABNORMAL LOW (ref 36.0–46.0)
Hemoglobin: 10.6 g/dL — ABNORMAL LOW (ref 12.0–15.0)
Immature Granulocytes: 2 %
Lymphocytes Relative: 16 %
Lymphs Abs: 1.2 10*3/uL (ref 0.7–4.0)
MCH: 30.1 pg (ref 26.0–34.0)
MCHC: 33.7 g/dL (ref 30.0–36.0)
MCV: 89.5 fL (ref 80.0–100.0)
Monocytes Absolute: 0.5 10*3/uL (ref 0.1–1.0)
Monocytes Relative: 7 %
Neutro Abs: 5.7 10*3/uL (ref 1.7–7.7)
Neutrophils Relative %: 73 %
Platelets: 211 10*3/uL (ref 150–400)
RBC: 3.52 MIL/uL — ABNORMAL LOW (ref 3.87–5.11)
RDW: 14.3 % (ref 11.5–15.5)
WBC: 7.7 10*3/uL (ref 4.0–10.5)
nRBC: 0 % (ref 0.0–0.2)

## 2018-08-17 LAB — BASIC METABOLIC PANEL
Anion gap: 8 (ref 5–15)
BUN: 10 mg/dL (ref 6–20)
CO2: 24 mmol/L (ref 22–32)
Calcium: 8.4 mg/dL — ABNORMAL LOW (ref 8.9–10.3)
Chloride: 109 mmol/L (ref 98–111)
Creatinine, Ser: 0.82 mg/dL (ref 0.44–1.00)
GFR calc Af Amer: >60 mL/min (ref >60)
GFR calc non Af Amer: >60 mL/min (ref >60)
Glucose, Bld: 95 mg/dL (ref 70–99)
Potassium: 3.7 mmol/L (ref 3.5–5.1)
Sodium: 141 mmol/L (ref 135–145)

## 2018-08-17 LAB — SARS CORONAVIRUS 2 BY RT PCR (HOSPITAL ORDER, PERFORMED IN ~~LOC~~ HOSPITAL LAB): SARS Coronavirus 2: NEGATIVE

## 2018-08-17 SURGERY — RESECTION, SEPTUM, UTERUS
Anesthesia: General | Site: Abdomen

## 2018-08-17 MED ORDER — ZOLPIDEM TARTRATE 5 MG PO TABS: 5.0000 mg | ORAL_TABLET | Freq: Every evening | ORAL | Status: DC | PRN

## 2018-08-17 MED ORDER — OXYTOCIN 10 UNIT/ML IJ SOLN
INTRAMUSCULAR | Status: AC
  Filled 2018-08-17: qty 4

## 2018-08-17 MED ORDER — LIDOCAINE 2% (20 MG/ML) 5 ML SYRINGE
INTRAMUSCULAR | Status: DC | PRN
  Administered 2018-08-17: 80 mg via INTRAVENOUS

## 2018-08-17 MED ORDER — PROPOFOL 10 MG/ML IV BOLUS
INTRAVENOUS | Status: AC
  Filled 2018-08-17: qty 20

## 2018-08-17 MED ORDER — ONDANSETRON HCL 4 MG/2ML IJ SOLN
INTRAMUSCULAR | Status: AC
  Filled 2018-08-17: qty 2

## 2018-08-17 MED ORDER — OXYCODONE HCL 5 MG PO TABS
5.0000 mg | ORAL_TABLET | Freq: Once | ORAL | Status: AC | PRN
  Administered 2018-08-17: 12:00:00 5 mg via ORAL

## 2018-08-17 MED ORDER — FENTANYL CITRATE (PF) 100 MCG/2ML IJ SOLN
INTRAMUSCULAR | Status: AC
  Filled 2018-08-17: qty 2

## 2018-08-17 MED ORDER — SODIUM CHLORIDE 0.9 % IV SOLN
1.0000 g | Freq: Two times a day (BID) | INTRAVENOUS | Status: AC
  Administered 2018-08-17 – 2018-08-19 (×4): 1 g via INTRAVENOUS
  Filled 2018-08-17 (×4): qty 1

## 2018-08-17 MED ORDER — ONDANSETRON HCL 4 MG/2ML IJ SOLN
INTRAMUSCULAR | Status: DC | PRN
  Administered 2018-08-17: 4 mg via INTRAVENOUS

## 2018-08-17 MED ORDER — IBUPROFEN 800 MG PO TABS
800.0000 mg | ORAL_TABLET | Freq: Three times a day (TID) | ORAL | Status: DC | PRN
  Administered 2018-08-17 – 2018-08-19 (×3): 800 mg via ORAL
  Filled 2018-08-17 (×3): qty 1

## 2018-08-17 MED ORDER — SODIUM CHLORIDE 0.9 % IV SOLN
INTRAVENOUS | Status: DC
  Administered 2018-08-17 – 2018-08-19 (×4): via INTRAVENOUS

## 2018-08-17 MED ORDER — FENTANYL CITRATE (PF) 100 MCG/2ML IJ SOLN
25.0000 ug | INTRAMUSCULAR | Status: DC | PRN
  Administered 2018-08-17 (×3): 50 ug via INTRAVENOUS

## 2018-08-17 MED ORDER — ONDANSETRON HCL 4 MG/2ML IJ SOLN: 4.0000 mg | Freq: Four times a day (QID) | INTRAMUSCULAR | Status: DC | PRN

## 2018-08-17 MED ORDER — SODIUM CHLORIDE (PF) 0.9 % IJ SOLN
INTRAMUSCULAR | Status: AC
  Filled 2018-08-17: qty 30

## 2018-08-17 MED ORDER — ROCURONIUM BROMIDE 10 MG/ML (PF) SYRINGE
PREFILLED_SYRINGE | INTRAVENOUS | Status: AC
  Filled 2018-08-17: qty 10

## 2018-08-17 MED ORDER — LACTATED RINGERS IV SOLN
INTRAVENOUS | Status: DC | PRN
  Administered 2018-08-17: 10:00:00 via INTRAVENOUS

## 2018-08-17 MED ORDER — ACETAMINOPHEN 500 MG PO TABS: 1000.0000 mg | ORAL_TABLET | Freq: Once | ORAL | Status: DC | PRN

## 2018-08-17 MED ORDER — POLYETHYLENE GLYCOL 3350 17 G PO PACK
17.0000 g | PACK | Freq: Every day | ORAL | Status: DC | PRN
  Administered 2018-08-19: 07:00:00 17 g via ORAL
  Filled 2018-08-17: qty 1

## 2018-08-17 MED ORDER — DEXMEDETOMIDINE HCL 200 MCG/2ML IV SOLN
INTRAVENOUS | Status: DC | PRN
  Administered 2018-08-17 (×2): 8 ug via INTRAVENOUS

## 2018-08-17 MED ORDER — HYDROMORPHONE HCL 1 MG/ML IJ SOLN
1.0000 mg | INTRAMUSCULAR | Status: DC | PRN
  Administered 2018-08-17 – 2018-08-18 (×4): 1 mg via INTRAVENOUS
  Filled 2018-08-17 (×4): qty 1

## 2018-08-17 MED ORDER — ACETAMINOPHEN 160 MG/5ML PO SOLN: 1000.0000 mg | Freq: Once | ORAL | Status: DC | PRN

## 2018-08-17 MED ORDER — SUGAMMADEX SODIUM 200 MG/2ML IV SOLN
INTRAVENOUS | Status: DC | PRN
  Administered 2018-08-17: 250 mg via INTRAVENOUS
  Administered 2018-08-17: 100 mg via INTRAVENOUS

## 2018-08-17 MED ORDER — MIDAZOLAM HCL 2 MG/2ML IJ SOLN
INTRAMUSCULAR | Status: DC | PRN
  Administered 2018-08-17: 2 mg via INTRAVENOUS

## 2018-08-17 MED ORDER — PROPOFOL 10 MG/ML IV BOLUS
INTRAVENOUS | Status: DC | PRN
  Administered 2018-08-17: 110 mg via INTRAVENOUS
  Administered 2018-08-17: 100 mg via INTRAVENOUS

## 2018-08-17 MED ORDER — ACETAMINOPHEN 10 MG/ML IV SOLN
INTRAVENOUS | Status: AC
  Filled 2018-08-17: qty 100

## 2018-08-17 MED ORDER — GLYCOPYRROLATE PF 0.2 MG/ML IJ SOSY
PREFILLED_SYRINGE | INTRAMUSCULAR | Status: DC | PRN
  Administered 2018-08-17: .2 mg via INTRAVENOUS

## 2018-08-17 MED ORDER — FUROSEMIDE 10 MG/ML IJ SOLN
10.0000 mg | Freq: Once | INTRAMUSCULAR | Status: AC
  Administered 2018-08-17: 18:00:00 10 mg via INTRAVENOUS
  Filled 2018-08-17: qty 2

## 2018-08-17 MED ORDER — OXYCODONE HCL 5 MG PO TABS
5.0000 mg | ORAL_TABLET | ORAL | Status: DC | PRN
  Administered 2018-08-17 (×2): 5 mg via ORAL
  Administered 2018-08-18: 10 mg via ORAL
  Administered 2018-08-18 (×2): 5 mg via ORAL
  Administered 2018-08-18: 05:00:00 10 mg via ORAL
  Filled 2018-08-17: qty 1
  Filled 2018-08-17: qty 2
  Filled 2018-08-17: qty 1
  Filled 2018-08-17: qty 2
  Filled 2018-08-17: qty 1
  Filled 2018-08-17 (×2): qty 2

## 2018-08-17 MED ORDER — DEXAMETHASONE SODIUM PHOSPHATE 10 MG/ML IJ SOLN
INTRAMUSCULAR | Status: AC
  Filled 2018-08-17: qty 1

## 2018-08-17 MED ORDER — OXYCODONE HCL 5 MG PO TABS
ORAL_TABLET | ORAL | Status: AC
  Administered 2018-08-17: 20:00:00 5 mg via ORAL
  Filled 2018-08-17: qty 1

## 2018-08-17 MED ORDER — NITROGLYCERIN 0.2 MG/ML ON CALL CATH LAB
INTRAVENOUS | Status: DC | PRN
  Administered 2018-08-17: 100 ug via INTRAVENOUS

## 2018-08-17 MED ORDER — TRANEXAMIC ACID 1000 MG/10ML IV SOLN
INTRAVENOUS | Status: DC | PRN
  Administered 2018-08-17: 1000 mg via INTRAVENOUS

## 2018-08-17 MED ORDER — MIDAZOLAM HCL 2 MG/2ML IJ SOLN
INTRAMUSCULAR | Status: AC
  Filled 2018-08-17: qty 2

## 2018-08-17 MED ORDER — FENTANYL CITRATE (PF) 250 MCG/5ML IJ SOLN
INTRAMUSCULAR | Status: DC | PRN
  Administered 2018-08-17: 50 ug via INTRAVENOUS
  Administered 2018-08-17: 100 ug via INTRAVENOUS
  Administered 2018-08-17: 50 ug via INTRAVENOUS

## 2018-08-17 MED ORDER — TRANEXAMIC ACID-NACL 1000-0.7 MG/100ML-% IV SOLN
INTRAVENOUS | Status: AC
  Filled 2018-08-17: qty 100

## 2018-08-17 MED ORDER — SODIUM CHLORIDE 0.9 % IV SOLN
INTRAVENOUS | Status: DC | PRN
  Administered 2018-08-17: 10:00:00 41.67 m[IU]/min via INTRAVENOUS

## 2018-08-17 MED ORDER — KETOROLAC TROMETHAMINE 30 MG/ML IJ SOLN
30.0000 mg | Freq: Once | INTRAMUSCULAR | Status: AC
  Administered 2018-08-17: 13:00:00 30 mg via INTRAVENOUS
  Filled 2018-08-17: qty 1

## 2018-08-17 MED ORDER — OXYCODONE HCL 5 MG/5ML PO SOLN: 5.0000 mg | Freq: Once | ORAL | Status: AC | PRN

## 2018-08-17 MED ORDER — FENTANYL CITRATE (PF) 250 MCG/5ML IJ SOLN
INTRAMUSCULAR | Status: AC
  Filled 2018-08-17: qty 5

## 2018-08-17 MED ORDER — ONDANSETRON HCL 4 MG PO TABS: 4.0000 mg | ORAL_TABLET | Freq: Four times a day (QID) | ORAL | Status: DC | PRN

## 2018-08-17 MED ORDER — DEXAMETHASONE SODIUM PHOSPHATE 10 MG/ML IJ SOLN
INTRAMUSCULAR | Status: DC | PRN
  Administered 2018-08-17: 10 mg via INTRAVENOUS

## 2018-08-17 MED ORDER — SODIUM CHLORIDE 0.9 % IV BOLUS: 500.0000 mL | Freq: Once | INTRAVENOUS | Status: AC

## 2018-08-17 MED ORDER — ACETAMINOPHEN 10 MG/ML IV SOLN
1000.0000 mg | Freq: Once | INTRAVENOUS | Status: DC | PRN
  Administered 2018-08-17: 12:00:00 1000 mg via INTRAVENOUS

## 2018-08-17 MED ORDER — SUCCINYLCHOLINE CHLORIDE 200 MG/10ML IV SOSY
PREFILLED_SYRINGE | INTRAVENOUS | Status: DC | PRN
  Administered 2018-08-17: 100 mg via INTRAVENOUS

## 2018-08-17 MED ORDER — LIDOCAINE 2% (20 MG/ML) 5 ML SYRINGE
INTRAMUSCULAR | Status: AC
  Filled 2018-08-17: qty 5

## 2018-08-17 MED ORDER — PANTOPRAZOLE SODIUM 40 MG PO TBEC
40.0000 mg | DELAYED_RELEASE_TABLET | Freq: Every day | ORAL | Status: DC
  Administered 2018-08-18 – 2018-08-19 (×2): 40 mg via ORAL
  Filled 2018-08-17 (×2): qty 1

## 2018-08-17 MED ORDER — ROCURONIUM BROMIDE 10 MG/ML (PF) SYRINGE
PREFILLED_SYRINGE | INTRAVENOUS | Status: DC | PRN
  Administered 2018-08-17: 50 mg via INTRAVENOUS

## 2018-08-17 MED ORDER — SODIUM CHLORIDE 0.9 % IV BOLUS
500.0000 mL | Freq: Once | INTRAVENOUS | Status: AC
  Administered 2018-08-17: 18:00:00 500 mL via INTRAVENOUS

## 2018-08-17 SURGICAL SUPPLY — 41 items
BALLN POSTPARTUM SOS BAKRI (BALLOONS) ×3
BALLOON POSTPARTUM SOS BAKRI (BALLOONS) ×1 IMPLANT
CANISTER SUCT 3000ML PPV (MISCELLANEOUS) ×3 IMPLANT
COVER WAND RF STERILE (DRAPES) IMPLANT
DRAPE WARM FLUID 44X44 (DRAPES) IMPLANT
DRSG OPSITE POSTOP 4X10 (GAUZE/BANDAGES/DRESSINGS) ×3 IMPLANT
DURAPREP 26ML APPLICATOR (WOUND CARE) ×3 IMPLANT
GAUZE 4X4 16PLY RFD (DISPOSABLE) IMPLANT
GLOVE BIOGEL PI IND STRL 7.0 (GLOVE) ×3 IMPLANT
GLOVE BIOGEL PI IND STRL 9 (GLOVE) ×2 IMPLANT
GLOVE BIOGEL PI INDICATOR 7.0 (GLOVE) ×6
GLOVE BIOGEL PI INDICATOR 9 (GLOVE) ×4
GLOVE ECLIPSE 9.0 STRL (GLOVE) ×3 IMPLANT
GOWN STRL REUS W/ TWL LRG LVL3 (GOWN DISPOSABLE) IMPLANT
GOWN STRL REUS W/TWL 2XL LVL3 (GOWN DISPOSABLE) ×3 IMPLANT
GOWN STRL REUS W/TWL LRG LVL3 (GOWN DISPOSABLE)
HIBICLENS CHG 4% 4OZ BTL (MISCELLANEOUS) ×3 IMPLANT
KIT TURNOVER KIT B (KITS) ×3 IMPLANT
NS IRRIG 1000ML POUR BTL (IV SOLUTION) ×3 IMPLANT
PACK ABDOMINAL GYN (CUSTOM PROCEDURE TRAY) ×3 IMPLANT
PAD ARMBOARD 7.5X6 YLW CONV (MISCELLANEOUS) IMPLANT
PAD OB MATERNITY 4.3X12.25 (PERSONAL CARE ITEMS) ×3 IMPLANT
PLUG CATH AND CAP STER (CATHETERS) ×3 IMPLANT
RETRACTOR WND ALEXIS 25 LRG (MISCELLANEOUS) IMPLANT
RTRCTR WOUND ALEXIS 25CM LRG (MISCELLANEOUS)
SHEET LAVH (DRAPES) ×3 IMPLANT
SPECIMEN JAR MEDIUM (MISCELLANEOUS) ×3 IMPLANT
SPONGE LAP 18X18 RF (DISPOSABLE) IMPLANT
STAPLER VISISTAT 35W (STAPLE) IMPLANT
SUT CHROMIC 0 CT 1 (SUTURE) IMPLANT
SUT CHROMIC 0 TIES 54 (SUTURE) IMPLANT
SUT CHROMIC 2 0 CT 1 (SUTURE) IMPLANT
SUT CHROMIC 2 0 SH (SUTURE) IMPLANT
SUT PLAIN 2 0 (SUTURE)
SUT PLAIN ABS 2-0 CT1 27XMFL (SUTURE) IMPLANT
SUT PROLENE 0 CT 1 30 (SUTURE) IMPLANT
SUT VIC AB 0 CT1 27 (SUTURE)
SUT VIC AB 0 CT1 27XBRD ANBCTR (SUTURE) IMPLANT
SUT VIC AB 2-0 CT1 36 (SUTURE) ×3 IMPLANT
TOWEL GREEN STERILE FF (TOWEL DISPOSABLE) ×6 IMPLANT
TRAY FOLEY W/BAG SLVR 14FR (SET/KITS/TRAYS/PACK) IMPLANT

## 2018-08-17 NOTE — Op Note (Signed)
Preop diagnosis: Postpartum uterine inversion Postop diagnosis: Postpartum uterine inversion, corrected, the re--suturing of the second-degree obstetric laceration Procedure 1.  Correction of uterine inversion with placement of bakri balloon and vaginal packing 2.  Re-suturing her second-degree obstetric laceration Surgeon Mallory Shirk, Arlina Robes MD Anesthesia General with endotracheal intubation Complications none Findings complete uterine inversion, with good relaxation achieved with IV nitroglycerin 100 mcg, able to invert uterus into anatomic position. Details of procedure: Patient was taken the operating room prepped and draped for combined abdominal and vaginal procedure.  Foley catheter was already in place.  Vaginal prep was performed with Betadine, timeout conducted, cefotetan 2 g administered preoperatively, and procedure confirmed by surgical team.  The bimanual exam revealed that the uterus was beefy inverted and had a firm feel that softened in response to intravenous nitroglycerin 100 mcg.  And then started by placing hand in the vagina and pushing upward on the posterior aspects of the uterus, just below the palpable contraction band in the lower uterine segment.  Some progress was me able to be made and gradually the uterus was able to be partially pushed up into its anatomic position.  Dr. Rip Harbour took over and was able to complete the procedure by continuing the process and pushing the uterus so that it flipped back into its proper position.  His palpation of the fundus of the uterus showed some irregularity raising the question of some residual decidual tissue or possibly a small fragment of placental tissue in the fundus.  Small amount of this could be debrided with manual swabbing. At this time ring forceps could be placed on the anterior cervix at 12:00 and bakri balloon placed inside the uterus with the inferior end of the back of the bakri balloon at the level of the contraction  ring.  350 cc of saline was instilled with good positioning of the balloon confirmed.  With a ring forceps still in place on the anterior lip of the cervix some vaginal packing with Betadine soaked Kerlix was placed in the posterior vaginal vault and over the cervix. Obstetric laceration repair.  At this time it was apparent that the sutures had pulled out of the second-degree obstetric laceration due to the manipulation and internal exam required.  This was then reapproximated using 2-0 Vicryl on CT needle, repairing the obstetric laceration in a 2 layer repair.  The Kerlix was then used to complete the vaginal packing and and Kerlix trimmed off.  The bakri balloon was plugged with a sponge.  There is been essentially no bleeding through the back re-balloon during the surgery and the suture repair. Patient was begun on tranexamic acid 1 g intravenous and IV oxytocin at 10 units/h.Marland Kitchen  By abdominal exam showed the uterine fundus at U- 2 cm, and the margins of the palpable uterus were marked on the abdominal wall with a skin marker.  she will be returned to postpartum floor, will receive IV cefotetan every 12 hours as long as the bakri balloon is in place with plans to decompress the balloon beginning at 24 hours at a rate of approximately 50 cc/h.  17 00 will be continued until Monday morning.  Sponge and needle counts were correct

## 2018-08-17 NOTE — Progress Notes (Signed)
Subjective: Hosp day 2  For postpartum uterine inversion,  Patient reports No pain or bleeding.  I have discussed the surgery at length with the patient and her mother, specifically reviewing the risks, potential complications including further bleeding, infection, risk to ureters, adjacent organs, and to future fertility. Patient is clear in her understanding , and her focus is on being healthy, and if hysterectomy is less risky than uterine preservation, pt accepts that treatment as practical..    Objective: I have reviewed patient's vital signs, intake and output and labs. Now receiving 4th unit prbc  General: alert, cooperative, appears stated age and moderately obese Resp: clear to auscultation bilaterally Cardio: regular rate and rhythm GI: soft, non-tender; bowel sounds normal; no masses,  no organomegaly Extremities: extremities normal, atraumatic, no cyanosis or edema and Homans sign is negative, no sign of DVT Vaginal Bleeding: minimal .   Assessment/Plan: Uterine inversion, postpartum day 5 Anemia, s/p 4 units overnight On Cefotetan,  Consented for surgery this a.m., all questions answered for pt and pts mother.  LOS: 1 day    Jonnie Kind 08/17/2018, 7:34 AM

## 2018-08-17 NOTE — Progress Notes (Signed)
Spoke to main OR. Case posted for 9AM. Will need another COVID swab. Dr. Glo Herring aware and will place order.

## 2018-08-17 NOTE — Transfer of Care (Signed)
Immediate Anesthesia Transfer of Care Note  Patient: Jacque Byron  Procedure(s) Performed: REVERSAL OF UTERINE INVERSION WITH PLACEMENT BAKRI BALLOON (N/A Abdomen)  Patient Location: PACU  Anesthesia Type:General  Level of Consciousness: awake and alert   Airway & Oxygen Therapy: Patient Spontanous Breathing and Patient connected to face mask oxygen  Post-op Assessment: Report given to RN and Post -op Vital signs reviewed and stable  Post vital signs: Reviewed and stable  Last Vitals:  Vitals Value Taken Time  BP 140/96 08/17/18 1113  Temp    Pulse 65 08/17/18 1116  Resp 25 08/17/18 1116  SpO2 99 % 08/17/18 1116  Vitals shown include unvalidated device data.  Last Pain:  Vitals:   08/17/18 0800  TempSrc:   PainSc: 0-No pain         Complications: No apparent anesthesia complications

## 2018-08-17 NOTE — Anesthesia Preprocedure Evaluation (Signed)
Anesthesia Evaluation  Patient identified by MRN, date of birth, ID band Patient awake    Reviewed: Allergy & Precautions, NPO status , Patient's Chart, lab work & pertinent test results  History of Anesthesia Complications Negative for: history of anesthetic complications  Airway Mallampati: III  TM Distance: >3 FB Neck ROM: Full    Dental  (+) Dental Advisory Given   Pulmonary neg pulmonary ROS,    breath sounds clear to auscultation       Cardiovascular negative cardio ROS   Rhythm:Regular     Neuro/Psych negative neurological ROS  negative psych ROS   GI/Hepatic negative GI ROS, Neg liver ROS,   Endo/Other  Morbid obesity  Renal/GU negative Renal ROS     Musculoskeletal   Abdominal   Peds  Hematology  (+) anemia ,   Anesthesia Other Findings   Reproductive/Obstetrics                             Anesthesia Physical Anesthesia Plan  ASA: III  Anesthesia Plan: General   Post-op Pain Management:    Induction: Intravenous  PONV Risk Score and Plan: 3 and Ondansetron and Dexamethasone  Airway Management Planned: Oral ETT  Additional Equipment: None  Intra-op Plan:   Post-operative Plan:   Informed Consent: I have reviewed the patients History and Physical, chart, labs and discussed the procedure including the risks, benefits and alternatives for the proposed anesthesia with the patient or authorized representative who has indicated his/her understanding and acceptance.     Dental advisory given  Plan Discussed with: CRNA and Surgeon  Anesthesia Plan Comments:         Anesthesia Quick Evaluation

## 2018-08-17 NOTE — Anesthesia Procedure Notes (Signed)
Procedure Name: Intubation Date/Time: 08/17/2018 9:49 AM Performed by: Bryson Corona, CRNA Pre-anesthesia Checklist: Patient identified, Emergency Drugs available, Suction available and Patient being monitored Patient Re-evaluated:Patient Re-evaluated prior to induction Oxygen Delivery Method: Circle System Utilized Preoxygenation: Pre-oxygenation with 100% oxygen Induction Type: IV induction Laryngoscope Size: Mac and 3 Grade View: Grade I Tube type: Oral Tube size: 7.0 mm Number of attempts: 1 Airway Equipment and Method: Stylet Placement Confirmation: ETT inserted through vocal cords under direct vision,  positive ETCO2 and breath sounds checked- equal and bilateral Secured at: 22 cm Tube secured with: Tape Dental Injury: Teeth and Oropharynx as per pre-operative assessment

## 2018-08-17 NOTE — Progress Notes (Signed)
Patient ID: Michele Fox, female   DOB: 30-Dec-1995, 23 y.o.   MRN: [id number] HD # 1 DOS Replacement of uterine inversion with Barki balloon palcement PPD # 5  Pt reports feeling better. Min pain. Tolerating diet.  PE AF VSS  UOP 125 cc last 6 hrs Lungs clear Heart RRR Abd soft + BS Uterus firm U-2 GU no bleeding  A/P Stable Will give fluid bolus and IV lasix x 1. Continue with current management Family and pt updated on POC

## 2018-08-17 NOTE — Progress Notes (Signed)
Report given to [place], RN in short-stay.

## 2018-08-17 NOTE — Progress Notes (Signed)
COVID screening complete and walked to lab. Verified with Mabe in OR that MRSA PCR did not need completion prior to OR.

## 2018-08-17 NOTE — Brief Op Note (Signed)
08/17/2018  11:42 AM  PATIENT:  Michele Fox  23 y.o. female  PRE-OPERATIVE DIAGNOSIS:  UTERINE INVERSION  POST-OPERATIVE DIAGNOSIS:  UTERINE INVERSION  PROCEDURE:  Procedure(s): REVERSAL OF UTERINE INVERSION WITH PLACEMENT BAKRI BALLOON (N/A) resuturing of obstetric laceration  SURGEON:  Surgeon(s) and Role:    * Jonnie Kind, MD - Primary    * Chancy Milroy, MD  PHYSICIAN ASSISTANT:   ASSISTANTS: none   ANESTHESIA:   general  EBL:  400 mL   BLOOD ADMINISTERED:none  DRAINS: Urinary Catheter (Foley) and bakri balloon, and vaginal packing with Kerlex/betadine   LOCAL MEDICATIONS USED:  NONE  SPECIMEN:  No Specimen  DISPOSITION OF SPECIMEN:  N/A  COUNTS:  YES  TOURNIQUET:  * No tourniquets in log *  DICTATION: .Dragon Dictation  PLAN OF CARE: has admission orders  PATIENT DISPOSITION:  PACU - hemodynamically stable.   Delay start of Pharmacological VTE agent (>24hrs) due to surgical blood loss or risk of bleeding: yes

## 2018-08-17 NOTE — OR Nursing (Signed)
Patient to OR with foley cath in place so no new catheter inserted in the OR

## 2018-08-17 NOTE — Anesthesia Postprocedure Evaluation (Signed)
Anesthesia Post Note  Patient: Michele Fox  Procedure(s) Performed: REVERSAL OF UTERINE INVERSION WITH PLACEMENT BAKRI BALLOON (N/A Abdomen)     Patient location during evaluation: PACU Anesthesia Type: General Level of consciousness: awake and alert Pain management: pain level controlled Vital Signs Assessment: post-procedure vital signs reviewed and stable Respiratory status: spontaneous breathing, nonlabored ventilation, respiratory function stable and patient connected to nasal cannula oxygen Cardiovascular status: blood pressure returned to baseline and stable Postop Assessment: no apparent nausea or vomiting Anesthetic complications: no    Last Vitals:  Vitals:   08/17/18 1816 08/17/18 1904  BP:    Pulse: (!) 52   Resp:    Temp:    SpO2: 94% 94%    Last Pain:  Vitals:   08/17/18 1619  TempSrc:   PainSc: 8                  Diarra Kos

## 2018-08-17 NOTE — Progress Notes (Addendum)
Covid pre-procedure testing re-ordered. Unable to identify if order done in MAU earlier. Surgical procedure for Uterine Inversion reversal, possible abdominal hysterectomy scheduled for 9 am this morning.

## 2018-08-18 ENCOUNTER — Encounter (HOSPITAL_COMMUNITY): Payer: Self-pay | Admitting: Obstetrics and Gynecology

## 2018-08-18 DIAGNOSIS — N855 Inversion of uterus: Secondary | ICD-10-CM

## 2018-08-18 LAB — CBC
HCT: 26.1 % — ABNORMAL LOW (ref 36.0–46.0)
Hemoglobin: 9 g/dL — ABNORMAL LOW (ref 12.0–15.0)
MCH: 30.3 pg (ref 26.0–34.0)
MCHC: 34.5 g/dL (ref 30.0–36.0)
MCV: 87.9 fL (ref 80.0–100.0)
Platelets: 198 10*3/uL (ref 150–400)
RBC: 2.97 MIL/uL — ABNORMAL LOW (ref 3.87–5.11)
RDW: 14.4 % (ref 11.5–15.5)
WBC: 10.3 10*3/uL (ref 4.0–10.5)
nRBC: 0 % (ref 0.0–0.2)

## 2018-08-18 LAB — BASIC METABOLIC PANEL
Anion gap: 8 (ref 5–15)
BUN: 9 mg/dL (ref 6–20)
CO2: 22 mmol/L (ref 22–32)
Calcium: 8 mg/dL — ABNORMAL LOW (ref 8.9–10.3)
Chloride: 109 mmol/L (ref 98–111)
Creatinine, Ser: 0.75 mg/dL (ref 0.44–1.00)
GFR calc Af Amer: >60 mL/min (ref >60)
GFR calc non Af Amer: >60 mL/min (ref >60)
Glucose, Bld: 88 mg/dL (ref 70–99)
Potassium: 3.6 mmol/L (ref 3.5–5.1)
Sodium: 139 mmol/L (ref 135–145)

## 2018-08-18 MED ORDER — MENTHOL 3 MG MT LOZG
1.0000 | LOZENGE | OROMUCOSAL | Status: DC | PRN
  Administered 2018-08-18 (×2): 3 mg via ORAL
  Filled 2018-08-18: qty 9

## 2018-08-18 MED ORDER — AMOXICILLIN-POT CLAVULANATE 875-125 MG PO TABS: 1.0000 | ORAL_TABLET | Freq: Two times a day (BID) | ORAL | Status: DC

## 2018-08-18 MED ORDER — SODIUM CHLORIDE 0.9 % IV BOLUS
500.0000 mL | Freq: Once | INTRAVENOUS | Status: AC
  Administered 2018-08-18: 14:00:00 500 mL via INTRAVENOUS

## 2018-08-18 NOTE — Progress Notes (Signed)
Dr. Harolyn Rutherford notified of low urinary output. Told to leave foley in place for now, encourage pt to drink fluids, and give a 500 cc bolus of 0.9 NS. Will continue to monitor.

## 2018-08-18 NOTE — Plan of Care (Signed)
Pt is doing well today. Baby in the room and bonding. Baby is not our patient at this time r/t readmission pp. Pt's mother is in room and is caring for the baby with feeding and other needs when pt is sleeping or if it requires much movement. Pt is pumping well. Pain assessments tend to be inconsistent with pt actions and the faces scale. I educated her on what each means and she will be eating or sleeping and say pain is "8/10 or 10/10". Medications given based on verbal score and if appropriate. Advil 800 mg added to regimen by Dr. Rip Harbour this evening. Pt verbalized understanding; will continue to assess and educate.

## 2018-08-18 NOTE — Progress Notes (Signed)
Bakari balloon removed and pt tolerated well. Will monitor for bleeding.

## 2018-08-18 NOTE — Lactation Note (Signed)
Lactation Consultation Note  Patient Name: Michele Fox SHUOH'F Date: 08/18/2018 Reason for consult: Follow-up assessment;Other (Comment)(readmit)  Visited with mom, she's a readmit due to bleeding, she's rooming in with baby. Mom not interested in latching on at this point, but requested some assistance with pumping. She and GOB told LC that baby is having difficulties with the formula, they've switched him to Similac sensitive but it's not helping much, mom voiced that her breastmilk is the one that will upset baby's stomach the least.  Assisted mom with pumping, she's wearing size # 27 flanges but only pumping one side at a time, and using paper towels to clean up the other side. Advised mom to do bilateral pumping instead to avoid wasting all that precious breastmilk. Reviewed breastmilk storage guidelines, mom told LC that she has flat nipples and was given shells but that she hasn't been using them. Mom said she'll try to working on latching later on because she's still tired and not feeling at her best, that's why she only pumped 3 times yesterday, but she'll try to do it more often.  LC also advised mom to get a [place] pump if it's too troublesome for her to hold pump pieces bilaterally, mom is obese and due to her anatomy is challenging to pump sitting down, some spillage was noticed when she lost the "grip" of the flanges.  Feeding plan:  1. Encouraged mom to pump at least 8 times/24 hours. If mom still not feeling well, she'll try to do a minimum of 6 pumping sessions. 2. Mom will try to minimize the amount of formula offered and try to get more breastmilk. She'll ask for assistance if she needs to take baby to breast  Mom reported all questions and concerns were answered, she's aware of [place] OP services and will call PRN.  Maternal Data    Feeding    LATCH Score                   Interventions Interventions: Breast feeding basics reviewed;Breast massage;Breast  compression;DEBP;Expressed milk  Lactation Tools Discussed/Used Pump Review: Setup, frequency, and cleaning;Milk Storage Initiated by:: RN and MPeck Date initiated:: 08/18/18   Consult Status Consult Status: Follow-up Date: 08/19/18 Follow-up type: In-patient    Michele Fox Michele Fox 08/18/2018, 12:32 PM

## 2018-08-18 NOTE — Progress Notes (Signed)
Patient ID: Michele Fox, female   DOB: Mar 30, 1995, 23 y.o.   MRN: [id number] HD # 2 POD # 1 Replacement of uterine inversion with Barki balloon placement PPD # 6 TSVD  Pt reports a little pain at time. Pain medication helps. Tolerating diet.  PE AF VSS  Lungs clear Heart RRR Abd soft + BS uterus U-2 firm non tender GU vaginal packing removed Ext SCD's  A/P Stable Will begin deflating balloon for removal. D/C foley after Barki balloon removed Continue with antibiotics. Continue with progressive care

## 2018-08-19 ENCOUNTER — Encounter (HOSPITAL_COMMUNITY): Payer: Self-pay | Admitting: Nurse Practitioner

## 2018-08-19 LAB — BPAM RBC
Blood Product Expiration Date: 202008152359
Blood Product Expiration Date: 202008152359
Blood Product Expiration Date: 202008152359
Blood Product Expiration Date: 202008152359
Blood Product Expiration Date: 202008152359
Blood Product Expiration Date: 202008162359
Blood Product Expiration Date: 202008162359
Blood Product Expiration Date: 202008162359
ISSUE DATE / TIME: 202007241453
ISSUE DATE / TIME: 202007250012
ISSUE DATE / TIME: 202007250343
ISSUE DATE / TIME: 202007250606
ISSUE DATE / TIME: 202007251023
ISSUE DATE / TIME: 202007251023
Unit Type and Rh: 6200
Unit Type and Rh: 6200
Unit Type and Rh: 6200
Unit Type and Rh: 6200
Unit Type and Rh: 6200
Unit Type and Rh: 6200
Unit Type and Rh: 6200
Unit Type and Rh: 6200

## 2018-08-19 LAB — TYPE AND SCREEN
ABO/RH(D): A POS
Antibody Screen: NEGATIVE
Unit division: 0
Unit division: 0
Unit division: 0
Unit division: 0
Unit division: 0
Unit division: 0
Unit division: 0
Unit division: 0

## 2018-08-19 LAB — COMPREHENSIVE METABOLIC PANEL
ALT: 15 U/L (ref 0–44)
AST: 18 U/L (ref 15–41)
Albumin: 2.3 g/dL — ABNORMAL LOW (ref 3.5–5.0)
Alkaline Phosphatase: 53 U/L (ref 38–126)
Anion gap: 9 (ref 5–15)
BUN: 11 mg/dL (ref 6–20)
CO2: 22 mmol/L (ref 22–32)
Calcium: 8 mg/dL — ABNORMAL LOW (ref 8.9–10.3)
Chloride: 108 mmol/L (ref 98–111)
Creatinine, Ser: 0.88 mg/dL (ref 0.44–1.00)
GFR calc Af Amer: >60 mL/min (ref >60)
GFR calc non Af Amer: >60 mL/min (ref >60)
Glucose, Bld: 90 mg/dL (ref 70–99)
Potassium: 3.6 mmol/L (ref 3.5–5.1)
Sodium: 139 mmol/L (ref 135–145)
Total Bilirubin: 0.5 mg/dL (ref 0.3–1.2)
Total Protein: 5 g/dL — ABNORMAL LOW (ref 6.5–8.1)

## 2018-08-19 LAB — CBC
HCT: 27.5 % — ABNORMAL LOW (ref 36.0–46.0)
Hemoglobin: 9.2 g/dL — ABNORMAL LOW (ref 12.0–15.0)
MCH: 30.3 pg (ref 26.0–34.0)
MCHC: 33.5 g/dL (ref 30.0–36.0)
MCV: 90.5 fL (ref 80.0–100.0)
Platelets: 213 10*3/uL (ref 150–400)
RBC: 3.04 MIL/uL — ABNORMAL LOW (ref 3.87–5.11)
RDW: 14.4 % (ref 11.5–15.5)
WBC: 8 10*3/uL (ref 4.0–10.5)
nRBC: 0 % (ref 0.0–0.2)

## 2018-08-19 MED ORDER — OXYCODONE HCL 5 MG PO TABS: 5.0000 mg | ORAL_TABLET | ORAL | 0 refills | Status: DC | PRN

## 2018-08-19 MED ORDER — LABETALOL HCL 5 MG/ML IV SOLN: 40.0000 mg | INTRAVENOUS | Status: DC | PRN

## 2018-08-19 MED ORDER — ENALAPRIL MALEATE 5 MG PO TABS: 5.0000 mg | ORAL_TABLET | Freq: Every day | ORAL | 1 refills | Status: DC

## 2018-08-19 MED ORDER — LABETALOL HCL 5 MG/ML IV SOLN: 20.0000 mg | INTRAVENOUS | Status: DC | PRN

## 2018-08-19 MED ORDER — AMOXICILLIN-POT CLAVULANATE 875-125 MG PO TABS: 1.0000 | ORAL_TABLET | Freq: Two times a day (BID) | ORAL | 0 refills | Status: DC

## 2018-08-19 MED ORDER — HYDRALAZINE HCL 20 MG/ML IJ SOLN: 10.0000 mg | INTRAMUSCULAR | Status: DC | PRN

## 2018-08-19 MED ORDER — MEDROXYPROGESTERONE ACETATE 150 MG/ML IM SUSP
150.0000 mg | Freq: Once | INTRAMUSCULAR | Status: AC
  Administered 2018-08-19: 12:00:00 150 mg via INTRAMUSCULAR
  Filled 2018-08-19: qty 1

## 2018-08-19 MED ORDER — HYDRALAZINE HCL 20 MG/ML IJ SOLN
5.0000 mg | INTRAMUSCULAR | Status: DC | PRN
  Filled 2018-08-19: qty 1

## 2018-08-19 NOTE — Progress Notes (Signed)
Michele Fox had BPs at 0628 168/93 and 0646 166/98. She stated "I'm nervous because I have to poop". I had just given her miralax and she was anticipating pain with this BM since it had been days since her last BM. She was in the bathroom after I called Dr. Domenica Reamer the first time (see provider Notifications) and got hydralazine protocol ordered. By the time pharmacy approved the order and I drew up the medication she was in the bathroom. She spent several minutes in there and due to shift change I left for about 10 min. For report then returned. She had just returned to bed and asked me to retake it because she felt much better. The retake was 150/86. Dr. Domenica Reamer was called back and she was okay with watching BP closely Q2 hr to see if the down trend continues. I did not give hydralazine due to being under severe range and per Dr. Domenica Reamer. Info passed to day shift RN.

## 2018-08-19 NOTE — Discharge Summary (Signed)
Physician Discharge Summary  Patient ID: Nabiha Planck MRN: [id number] DOB/AGE: 21-30-1997 21 y.o.  Admit date: 08/16/2018 Discharge date: 08/19/2018  Admission Diagnoses: Uterine inversion  Discharge Diagnoses:  Active Problems:   Uterine inversion associated with pregnancy   Discharged Condition: good  Hospital Course: Ms Oswaldo Done presented with uterine inversion on 08/16/18. She had a TSVD on 7/74/12 complicated by uterine inversion with uncomplicated replacement and postpartum course. She noted increased vaginal bleeding and inability to urinate on day of admission. On day of admission noted to be anemic, MRI demonstrated uterine inversion. She was admitted for further management On 08/17/18 she underwent EUA and replacement of uterine inversion with Barki balloon placement in the OR. She was transfused prior to the procedure.   Her post op course was unremarkable. She had minimum bleeding and no pain. Barki balloon was removed on POD # 1 without problems. She progressed to ambulating, voiding, tolerating diet and good oral pain control.  She did have some fluctuation in her BP with some serve range pressures. PEC labs were negative. She was started on Vasotec however.  She received Cefotan while in the hospital and was discharge home on Augmentin for 7 days due to uterine prolapse and uterine manipulation and balloon placement.  On POD # 2 felt pt was amendable for discharge home. Discharge medications, instructions and follow up were reviewed with pt and mother. Pt verbalized understanding.  Consults: None  Significant Diagnostic Studies: labs:  and radiology: MRI: uterus  Treatments: IV hydration, antibiotics: Ancef and surgery: See OP note  Discharge Exam: Blood pressure 128/76, pulse (!) 59, temperature 98.1 F (36.7 C), temperature source Oral, resp. rate 18, height 5\' 4"  (1.626 m), weight 121.6 kg, SpO2 100 %, unknown if currently breastfeeding. General appearance:  alert  Lungs clear Heart RRR Abd soft + BS uterus firm non tender U-2  GU min bleeding Ext non tencer  Disposition: Discharge disposition: 01-Home or Self Care       Discharge Instructions    Activity as tolerated   Complete by: As directed    Ambulatory referral to Lactation   Complete by: As directed    Reason for consult: The Mother-Infant Dyad Needs Assistance in the Continuation of Breastfeeding   Call MD for:  difficulty breathing, headache or visual disturbances   Complete by: As directed    Call MD for:  extreme fatigue   Complete by: As directed    Call MD for:  hives   Complete by: As directed    Call MD for:  persistant dizziness or light-headedness   Complete by: As directed    Call MD for:  persistant nausea and vomiting   Complete by: As directed    Call MD for:  redness, tenderness, or signs of infection (pain, swelling, redness, odor or green/yellow discharge around incision site)   Complete by: As directed    Call MD for:  severe uncontrolled pain   Complete by: As directed    Call MD for:  temperature >100.4   Complete by: As directed    Diet - low sodium heart healthy   Complete by: As directed    Sexual acrtivity   Complete by: As directed    Pelvic rest x 6 weeks     Allergies as of 08/19/2018   No Known Allergies     Medication List    TAKE these medications   amoxicillin-clavulanate 875-125 MG tablet Commonly known as: AUGMENTIN Take 1 tablet by mouth every 12 (twelve)  hours.   enalapril 5 MG tablet Commonly known as: Vasotec Take 1 tablet (5 mg total) by mouth daily.   ibuprofen 600 MG tablet Commonly known as: ADVIL Take 1 tablet (600 mg total) by mouth every 6 (six) hours.   oxyCODONE 5 MG immediate release tablet Commonly known as: Oxy IR/ROXICODONE Take 1 tablet (5 mg total) by mouth every 4 (four) hours as needed for moderate pain.   prenatal multivitamin Tabs tablet Take 1 tablet by mouth daily at 12 noon.       Follow-up [facility] Schedule an appointment as soon as possible for a visit.   Specialty: Obstetrics and Gynecology Why: 1 week with Dr Jerilynn Mages. Latrish Mogel 4 weeks PP visit with Dr. Thurston Hole information: [address] [place], [place] [id number] [place] [id number] [phone]          Signed: Chancy Milroy 08/19/2018, 11:48 AM

## 2018-08-19 NOTE — Discharge Instructions (Signed)

## 2018-08-20 ENCOUNTER — Other Ambulatory Visit: Payer: Self-pay | Admitting: Student

## 2018-08-30 ENCOUNTER — Ambulatory Visit: Payer: Medicaid Other | Admitting: Obstetrics and Gynecology

## 2018-09-12 ENCOUNTER — Other Ambulatory Visit: Payer: Self-pay | Admitting: General Practice

## 2018-09-12 DIAGNOSIS — N855 Inversion of uterus: Secondary | ICD-10-CM

## 2018-09-26 ENCOUNTER — Encounter: Payer: Self-pay | Admitting: Family Medicine

## 2018-09-26 ENCOUNTER — Ambulatory Visit: Payer: Medicaid Other | Admitting: Obstetrics and Gynecology

## 2018-10-31 ENCOUNTER — Other Ambulatory Visit: Payer: Self-pay

## 2018-11-01 NOTE — Telephone Encounter (Signed)
Please review for refill.  

## 2018-11-01 NOTE — Telephone Encounter (Signed)
Pt informed

## 2018-11-26 ENCOUNTER — Ambulatory Visit: Payer: Medicaid Other | Admitting: Obstetrics and Gynecology

## 2018-11-26 ENCOUNTER — Encounter: Payer: Self-pay | Admitting: Obstetrics and Gynecology

## 2018-12-10 ENCOUNTER — Encounter (HOSPITAL_COMMUNITY): Payer: Self-pay

## 2018-12-10 ENCOUNTER — Ambulatory Visit (HOSPITAL_COMMUNITY)
Admission: EM | Admit: 2018-12-10 | Discharge: 2018-12-10 | Disposition: A | Discharge status: Home / Self Care | Payer: Medicaid Other | Attending: Urgent Care | Admitting: Urgent Care

## 2018-12-10 DIAGNOSIS — J3089 Other allergic rhinitis: Secondary | ICD-10-CM | POA: Diagnosis not present

## 2018-12-10 DIAGNOSIS — R0981 Nasal congestion: Secondary | ICD-10-CM

## 2018-12-10 DIAGNOSIS — R0982 Postnasal drip: Secondary | ICD-10-CM

## 2018-12-10 MED ORDER — PSEUDOEPHEDRINE HCL 30 MG PO TABS: 30.0000 mg | ORAL_TABLET | Freq: Four times a day (QID) | ORAL | 0 refills | Status: DC | PRN

## 2018-12-10 MED ORDER — MONTELUKAST SODIUM 5 MG PO CHEW: 10.0000 mg | CHEWABLE_TABLET | Freq: Every day | ORAL | 0 refills | Status: DC

## 2018-12-10 MED ORDER — CETIRIZINE HCL 10 MG PO TABS: 10.0000 mg | ORAL_TABLET | Freq: Every day | ORAL | 0 refills | Status: DC

## 2018-12-10 NOTE — ED Triage Notes (Signed)
Pt states having sinus pressure x 3 days.

## 2018-12-10 NOTE — Discharge Instructions (Addendum)
You may pick up over-the-counter pseudoephedrine (Sudafed) and use this for post-nasal drainage, sinus congestion at a dose of 60mg  every 8 hours or 120mg  every 12 hours as needed. For sore throat try using a honey-based tea. Use 3 teaspoons of honey with juice squeezed from half lemon. Place shaved pieces of ginger into 1/2-1 cup of water and warm over stove top. Then mix the ingredients and repeat every 4 hours as needed.

## 2018-12-10 NOTE — ED Provider Notes (Signed)
[place]   MRN: [id number] DOB: [birth year]  Subjective:   Michele Fox is a 23 y.o. female presenting for 3-day history of mild to moderate persistent sinus congestion.  Patient admits history of allergies especially around this time of year.  However she ran out of insurance and stopped getting her allergy medications.  She does admit that she has had postnasal drainage and scratchy throat.  This is consistent with her history of allergies per patient.  Denies any known COVID-19 contacts.  Denies taking chronic medications.   No Known Allergies  History reviewed. No pertinent past medical history.   Past Surgical History:  Procedure Laterality Date  . UTERINE SEPTUM RESECTION N/A 08/17/2018   Procedure: REVERSAL OF UTERINE INVERSION WITH PLACEMENT BAKRI BALLOON;  Surgeon: Jonnie Kind, MD;  Location: [facility];  Service: Gynecology;  Laterality: N/A;    History reviewed. No pertinent family history.  Social History   Tobacco Use  . Smoking status: Never Smoker  . Smokeless tobacco: Never Used  Substance Use Topics  . Alcohol use: Not Currently    Frequency: Never  . Drug use: Never    ROS   Objective:   Vitals: BP 109/77 (BP Location: Left Arm)   Pulse 94   Temp 98.5 F (36.9 C) (Oral)   Resp 18   LMP  (Within Weeks) Comment: 5 weeks aprox  SpO2 96%   Breastfeeding No   Physical Exam Constitutional:      General: She is not in acute distress.    Appearance: Normal appearance. She is well-developed. She is not ill-appearing, toxic-appearing or diaphoretic.  HENT:     Head: Normocephalic and atraumatic.     Right Ear: Tympanic membrane and ear canal normal. No drainage or tenderness. No middle ear effusion. Tympanic membrane is not erythematous.     Left Ear: Tympanic membrane and ear canal normal. No drainage or tenderness.  No middle ear effusion. Tympanic membrane is not erythematous.     Nose: Nose normal. No congestion or rhinorrhea.   Mouth/Throat:     Mouth: Mucous membranes are moist. No oral lesions.     Pharynx: No pharyngeal swelling, oropharyngeal exudate, posterior oropharyngeal erythema or uvula swelling.     Tonsils: No tonsillar exudate or tonsillar abscesses.     Comments: Significant postnasal drainage. Eyes:     Extraocular Movements: Extraocular movements intact.     Right eye: Normal extraocular motion.     Left eye: Normal extraocular motion.     Conjunctiva/sclera: Conjunctivae normal.     Pupils: Pupils are equal, round, and reactive to light.  Neck:     Musculoskeletal: Normal range of motion and neck supple.  Cardiovascular:     Rate and Rhythm: Normal rate and regular rhythm.     Pulses: Normal pulses.     Heart sounds: Normal heart sounds. No murmur. No friction rub. No gallop.   Pulmonary:     Effort: Pulmonary effort is normal. No respiratory distress.     Breath sounds: Normal breath sounds. No stridor. No wheezing, rhonchi or rales.  Lymphadenopathy:     Cervical: No cervical adenopathy.  Skin:    General: Skin is warm and dry.     Findings: No rash.  Neurological:     General: No focal deficit present.     Mental Status: She is alert and oriented to person, place, and time.  Psychiatric:        Mood and Affect: Mood normal.  Behavior: Behavior normal.        Thought Content: Thought content normal.      Assessment and Plan :   1. Allergic rhinitis due to other allergic trigger, unspecified seasonality   2. Sinus congestion   3. Post-nasal drainage     We will have patient start Zyrtec and Singulair, use pseudoephedrine as needed.  Patient has previously been tested for COVID-19 and was negative, states that she does want to do this again.  Do not suspect infectious process, will monitor.  Counseled patient on potential for adverse effects with medications prescribed/recommended today, ER and return-to-clinic precautions discussed, patient verbalized understanding.     Jaynee Eagles, [place] 12/10/18 1639

## 2019-02-05 ENCOUNTER — Ambulatory Visit: Payer: Medicaid Other | Admitting: Obstetrics and Gynecology

## 2020-01-26 IMAGING — CT CT ABDOMEN AND PELVIS WITH CONTRAST
2 of 4 series · 14 of 46 positions shown, 16 images · IV contrast (Omni 300)
Comparison: None.
COMPARISON: None.

Addendum:
CLINICAL DATA: Vaginal bleeding and pelvic pain.  Chest gave birth.

EXAM:
CT ABDOMEN AND PELVIS WITH CONTRAST
TECHNIQUE: Multidetector CT imaging of the abdomen and pelvis was performed
using the standard protocol following bolus administration of
intravenous contrast.
CONTRAST:  100mL PSLIU8-6WW IOPAMIDOL (PSLIU8-6WW) INJECTION 61%

[Series 3: a/p w/ 5mm · axial · 0.98mm/px · z∈[+716,+1126]mm · 11 of 100 slices shown, 13 images]
[im 9/100  soft-tissue]
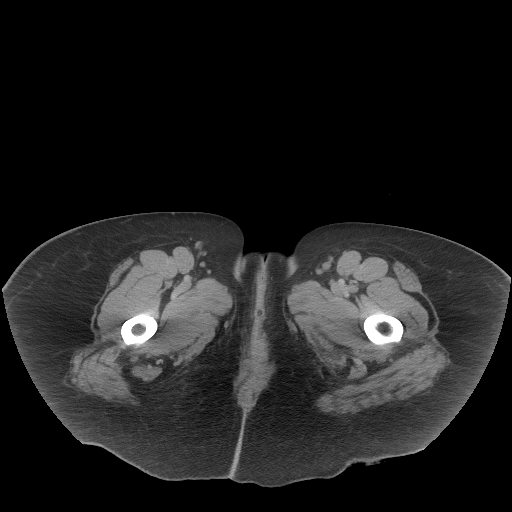
[im 9/100  bone]
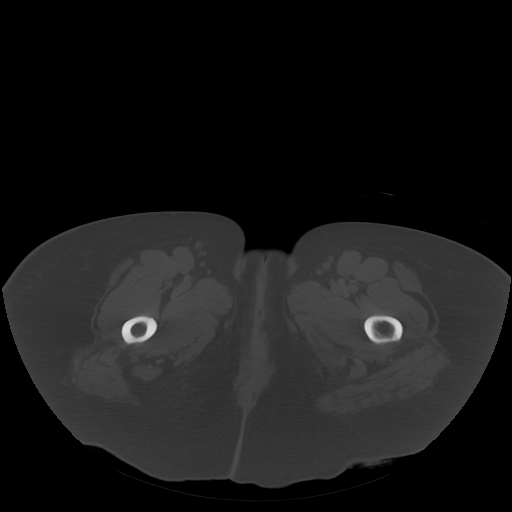
[im 17/100  soft-tissue]
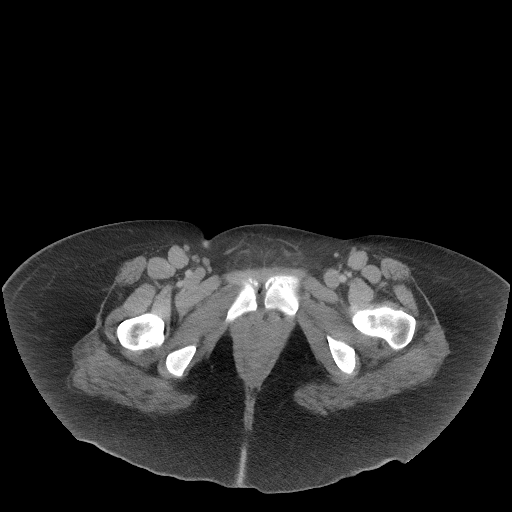
[im 25/100  soft-tissue]
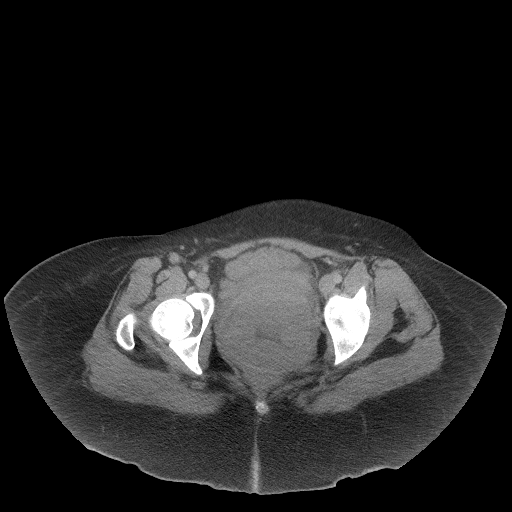
[im 34/100  soft-tissue]
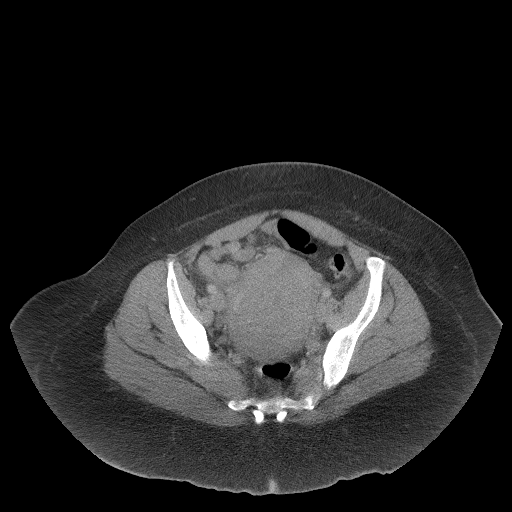
[im 42/100  soft-tissue]
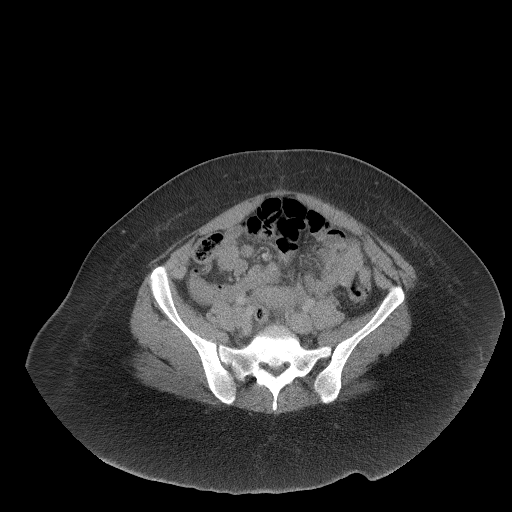
[im 50/100  soft-tissue]
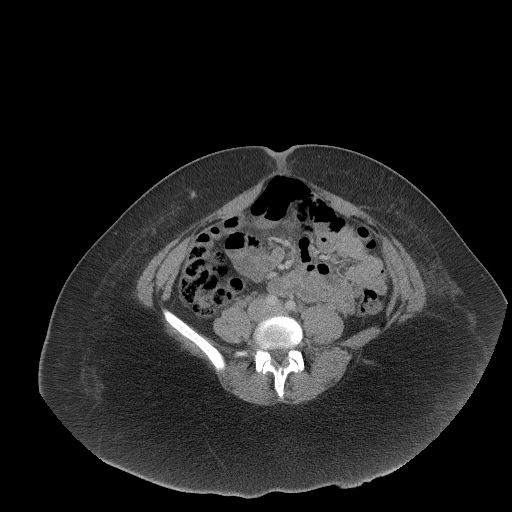
[im 58/100  soft-tissue]
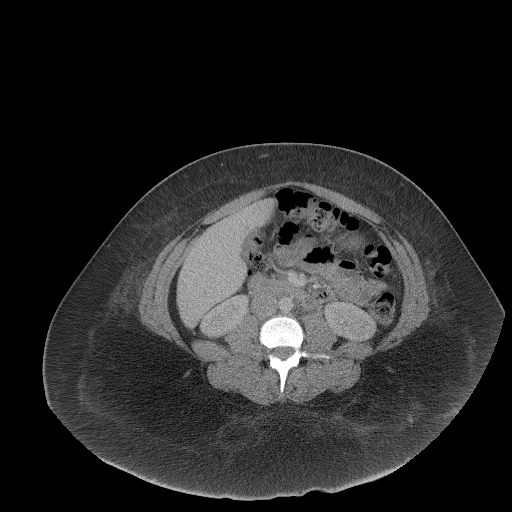
[im 67/100  soft-tissue]
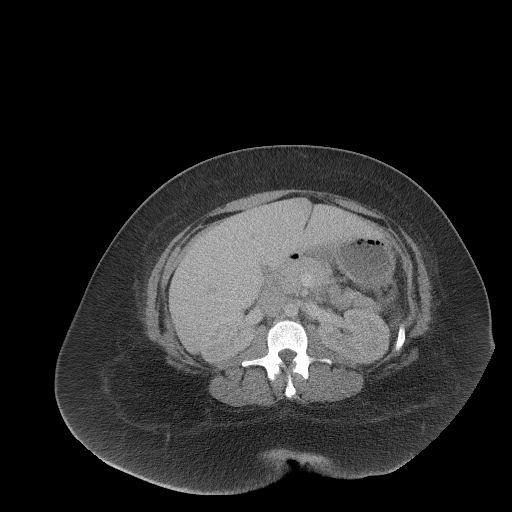
[im 75/100  soft-tissue]
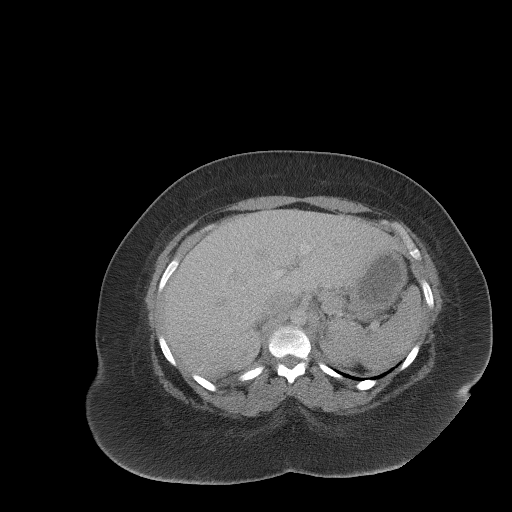
[im 75/100  bone]
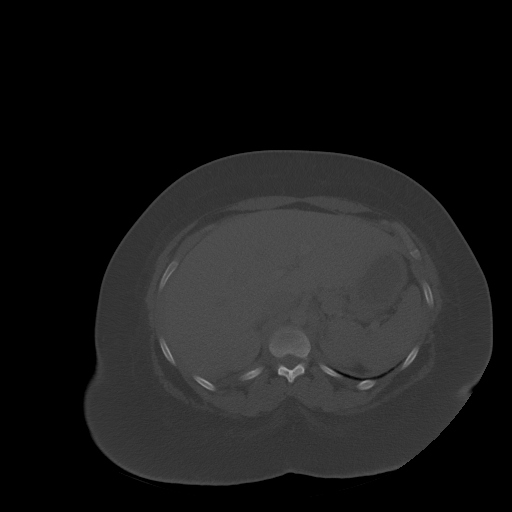
[im 83/100  soft-tissue]
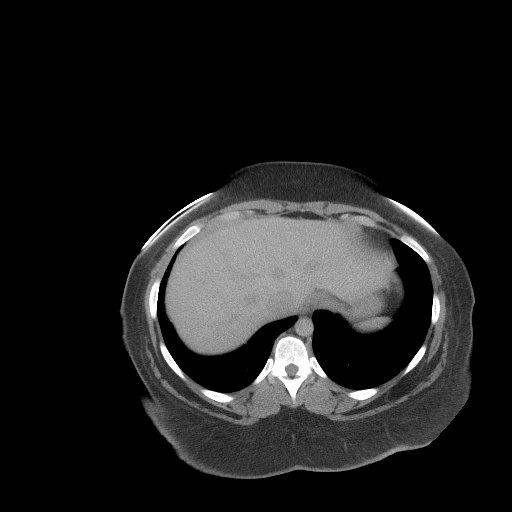
[im 91/100  soft-tissue]
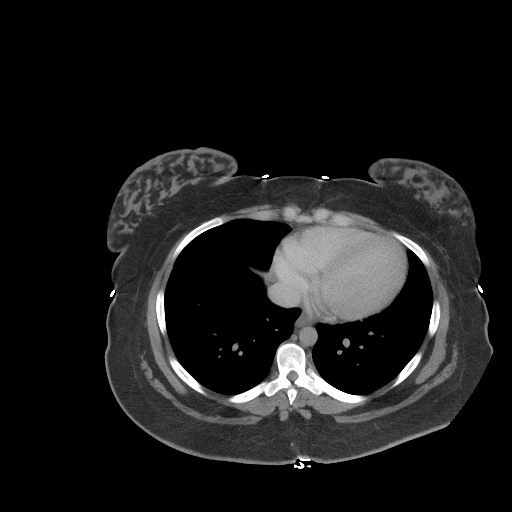

[Series 6: a/p w/ cor · coronal · 0.97mm/px · 3 of 201 slices shown]
[im 67/201  soft-tissue]
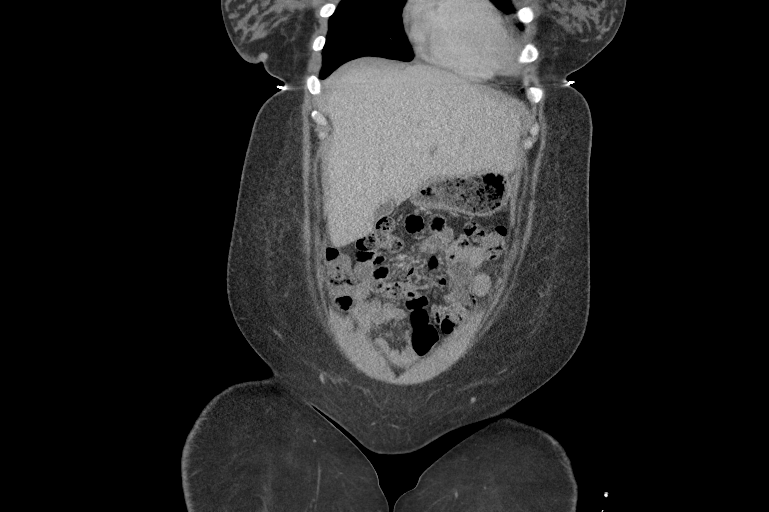
[im 89/201  soft-tissue]
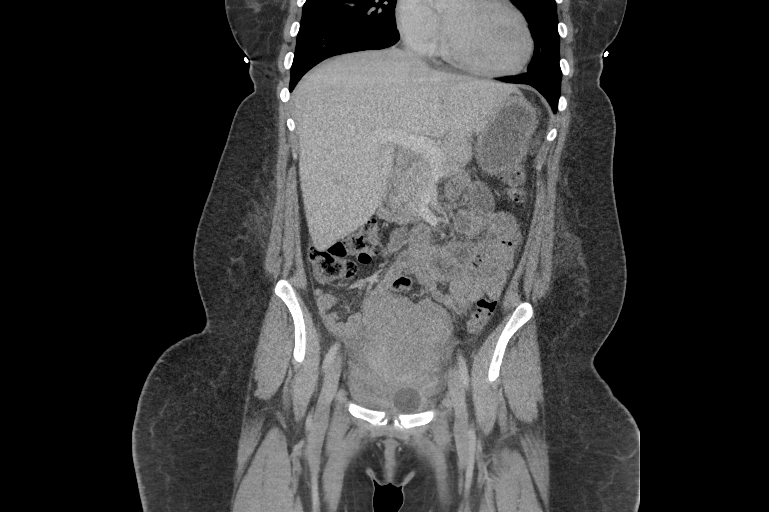
[im 112/201  soft-tissue]
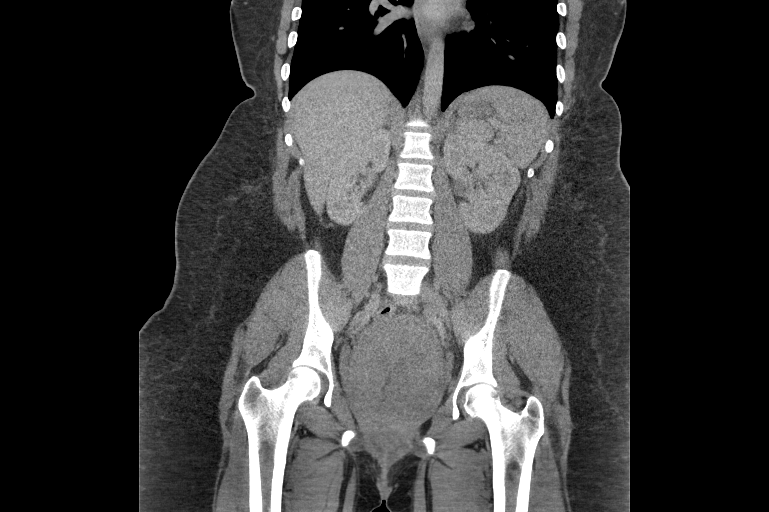

[14 of 46 positions shown; findings below may reference images not displayed]

FINDINGS: Lower chest: Clear lung bases.  Heart normal in size.

Hepatobiliary: No focal liver abnormality is seen. No gallstones,
gallbladder wall thickening, or biliary dilatation.

Pancreas: Unremarkable. No pancreatic ductal dilatation or
surrounding inflammatory changes.

Spleen: Normal in size without focal abnormality.

Adrenals/Urinary Tract: Adrenal glands are unremarkable. Kidneys are
normal, without renal calculi, focal lesion, or hydronephrosis.
Bladder decompressed by Foley catheter.

Stomach/Bowel: Stomach is within normal limits. Appendix appears
normal. No evidence of bowel wall thickening, distention, or
inflammatory changes.

Vascular/Lymphatic: No significant vascular findings are present. No
enlarged abdominal or pelvic lymph nodes.

Reproductive: Enlarged postpartum uterus. There is a small amount of
increased attenuation material lies along the anterior margin of the
uterus, consistent with hemorrhage. No visualized endometrial mass
or endometrial canal hemorrhage. No adnexal masses.

Other: None.

Musculoskeletal: No acute or significant osseous findings.
IMPRESSION: 1. Small amount of hemorrhage lies along the anterior surface of the
uterus. No evidence of endometrial canal hemorrhage.
2. No other acute abnormality within the abdomen or pelvis.

ADDENDUM:
After discussion with Dr. Almoush, there is clinical concern for
uterine inversion, occurring following vaginal delivery. On review
of the images, what may be the cervix is directed superiorly. The
endometrium appears to fan out inferiorly, suggesting that the
fundus of the uterus lies inferior. In addition, this apparent
uterine fundus lies inferior, extending to the level of the urethra.
This supports uterine inversion. However, this is not conclusive. A
pelvic/uterine MRI without contrast is recommended, to better assess
the uterine position and to exclude a possible fibroid.

*** End of Addendum ***
FINDINGS: Lower chest: Clear lung bases.  Heart normal in size.

Hepatobiliary: No focal liver abnormality is seen. No gallstones,
gallbladder wall thickening, or biliary dilatation.

Pancreas: Unremarkable. No pancreatic ductal dilatation or
surrounding inflammatory changes.

Spleen: Normal in size without focal abnormality.

Adrenals/Urinary Tract: Adrenal glands are unremarkable. Kidneys are
normal, without renal calculi, focal lesion, or hydronephrosis.
Bladder decompressed by Foley catheter.

Stomach/Bowel: Stomach is within normal limits. Appendix appears
normal. No evidence of bowel wall thickening, distention, or
inflammatory changes.

Vascular/Lymphatic: No significant vascular findings are present. No
enlarged abdominal or pelvic lymph nodes.

Reproductive: Enlarged postpartum uterus. There is a small amount of
increased attenuation material lies along the anterior margin of the
uterus, consistent with hemorrhage. No visualized endometrial mass
or endometrial canal hemorrhage. No adnexal masses.

Other: None.

Musculoskeletal: No acute or significant osseous findings.
IMPRESSION: 1. Small amount of hemorrhage lies along the anterior surface of the
uterus. No evidence of endometrial canal hemorrhage.
2. No other acute abnormality within the abdomen or pelvis.

## 2020-08-16 NOTE — Congregational Nurse Program (Signed)
Met client this afternoon for the first time, she and her toddler son. They had just returned from [facility] as she was checking on her Medicaid and her EBT .  Client states that she had completed an application on line but went in person just to confirm.  Client was told that her Medicaid had not transferred to [facility] from [place] and that she would have to wait 90 days for her EBT to transfer. Client reports no physical conditions for herself or her son.  Her son does need his 52 year old check up and they both need a PCP here in [facility] but she needs her MCD transferred first. Plan: Coordinator here at shelter will send a form verifying client living here at shelter and states that this should re-instate her EBT card. Nurse instructed client to call the [facility] number on her MCD card for customer care to assist her with transfer of MCD. Nurse will send list of Pediatricians in area who accept MCD for her son as well as for herself.  Client walks and uses city bus for transportation. Will meet with client next week at shelter.  Client to call if needed prior to then.  Joby Hershkowitz D. Joneen Caraway MSN, Agricultural consultant [employer] [phone]

## 2021-04-23 ENCOUNTER — Encounter: Payer: Medicaid Other | Admitting: Nurse Practitioner

## 2021-04-23 NOTE — Progress Notes (Signed)
This appointment was scheduled for patient's 24 year old son. I informed her of our policy and guidelines. She verbalized understanding.  ?

## 2021-11-16 ENCOUNTER — Ambulatory Visit: Payer: Medicaid Other | Admitting: Obstetrics and Gynecology

## 2022-01-23 NOTE — L&D Delivery Note (Signed)
Delivery Note Michele Fox is a 27 y.o. G2P1001 at [redacted]w[redacted]d admitted for IOL for FGR.  Developed intrapartum GHTN.   GBS Status:  Positive/-- (09/11 1627)  Labor course: Initial SVE: FT/thick/-3. Augmentation with: AROM, Pitocin, Cytotec, and IP Foley. She then progressed to complete.  ROM: 8h 21m with clear fluid  Birth: After a 3 push 2nd stage, she delivered a Live born female  Birth Weight:   APGAR: 7, 9  Newborn Delivery   Birth date/time: 10/20/2022 03:41:00 Delivery type: Vaginal, Spontaneous        Delivered via spontaneous vaginal delivery (Presentation: OA ). Nuchal cord present: No.. Shoulders and body delivered in usual fashion. Infant placed directly on mom's abdomen for bonding/skin-to-skin, baby dried and stimulated. Cord clamped x 2 after 1 minute and cut by FOB.  Cord blood collected. Placenta delivered-Spontaneous with 3 vessels. 20u Pitocin in 500cc LR given as a bolus prior to delivery of placenta.TXA 1gm also given IV  Fundus firm with massage, but prone to bogginess, so cytotec given PR. Placenta inspected and appears to be intact with a 3 VC.  Sponge and instrument count were correct x2.  Intrapartum complications:  None Anesthesia:  epidural Lacerations:  1st degree Suture Repair: 2.0 vicryl and 4-0 monocryl EBL (mL):520.00    Mom to postpartum.  Baby to Couplet care / Skin to Skin. Placenta to Pathology for FGR    Plans to Breastfeed Contraception:  undecided Circumcision: N/A  Note sent to [facility]: [facility] for pp visit.  Delivery Report:  Review the Delivery Report for details.     Signed: Jacklyn Shell, DNP,CNM 10/20/2022, 4:19 AM

## 2022-01-29 ENCOUNTER — Ambulatory Visit
Admission: EM | Admit: 2022-01-29 | Discharge: 2022-01-29 | Disposition: A | Discharge status: Home / Self Care | Payer: Medicaid Other | Attending: Urgent Care | Admitting: Urgent Care

## 2022-01-29 DIAGNOSIS — Z3202 Encounter for pregnancy test, result negative: Secondary | ICD-10-CM | POA: Diagnosis not present

## 2022-01-29 DIAGNOSIS — R8290 Unspecified abnormal findings in urine: Secondary | ICD-10-CM | POA: Insufficient documentation

## 2022-01-29 DIAGNOSIS — N912 Amenorrhea, unspecified: Secondary | ICD-10-CM | POA: Insufficient documentation

## 2022-01-29 LAB — POCT URINALYSIS DIP (MANUAL ENTRY)
Bilirubin, UA: NEGATIVE
Glucose, UA: NEGATIVE mg/dL
Ketones, POC UA: NEGATIVE mg/dL
Leukocytes, UA: NEGATIVE
Nitrite, UA: NEGATIVE
Protein Ur, POC: NEGATIVE mg/dL
Spec Grav, UA: 1.015 (ref 1.010–1.025)
Urobilinogen, UA: 0.2 E.U./dL
pH, UA: 6 (ref 5.0–8.0)

## 2022-01-29 LAB — POCT URINE PREGNANCY: Preg Test, Ur: NEGATIVE

## 2022-01-29 NOTE — ED Triage Notes (Addendum)
Pt requesting preg test-reports neg preg home test-LMP 12/22/2021-also c/o intermittent foul smelling urine since 01/07/2022-NAD-steady gait

## 2022-01-29 NOTE — ED Provider Notes (Signed)
[facility]  Note:  This document was prepared using Systems analyst and may include unintentional dictation errors.  MRN: [id number] DOB: 10-11-1995  Subjective:   Tamma Brigandi is a 27 y.o. female presenting for amenorrhea, malodorous urine.  Denies fever, n/v, abdominal pain, pelvic pain, rashes, dysuria, urinary frequency, hematuria, vaginal discharge.  Does not have regular cycles.  Does not have a gynecologist.  Patient did a pregnancy test at home and was negative.  She does have a history of uterine fibroids, uterine prolapse.  No other known gynecologic history.  LMP was 12/22/2021.  Does not hydrate well with water.  Sometimes drinks 2 bottles a day.  Drinks coffee steadily throughout the day.  No current facility-administered medications for this encounter.  Current Outpatient Medications:    cetirizine (ZYRTEC) 10 MG tablet, Take 1 tablet (10 mg total) by mouth daily., Disp: 90 tablet, Rfl: 0   montelukast (SINGULAIR) 5 MG chewable tablet, Chew 2 tablets (10 mg total) by mouth at bedtime., Disp: 180 tablet, Rfl: 0   Prenatal Vit-Fe Fumarate-FA (PRENATAL MULTIVITAMIN) TABS tablet, Take 1 tablet by mouth daily at 12 noon., Disp: , Rfl:    pseudoephedrine (SUDAFED) 30 MG tablet, Take 1-2 tablets (30-60 mg total) by mouth every 6 (six) hours as needed for congestion., Disp: 30 tablet, Rfl: 0   No Known Allergies  History reviewed. No pertinent past medical history.   Past Surgical History:  Procedure Laterality Date   UTERINE SEPTUM RESECTION N/A 08/17/2018   Procedure: REVERSAL OF UTERINE INVERSION WITH PLACEMENT BAKRI BALLOON;  Surgeon: Jonnie Kind, MD;  Location: [place];  Service: Gynecology;  Laterality: N/A;    No family history on file.  Social History   Tobacco Use   Smoking status: Never   Smokeless tobacco: Never  Vaping Use   Vaping Use: Never used  Substance Use Topics   Alcohol use: Yes    Comment: occ    Drug use: Never    ROS   Objective:   Vitals: BP 134/86 (BP Location: Left Arm)   Pulse 63   Temp 98.9 F (37.2 C) (Oral)   Resp 18   SpO2 98%   Physical Exam Constitutional:      General: She is not in acute distress.    Appearance: Normal appearance. She is well-developed. She is not ill-appearing, toxic-appearing or diaphoretic.  HENT:     Head: Normocephalic and atraumatic.     Nose: Nose normal.     Mouth/Throat:     Mouth: Mucous membranes are moist.  Eyes:     General: No scleral icterus.       Right eye: No discharge.        Left eye: No discharge.     Extraocular Movements: Extraocular movements intact.     Conjunctiva/sclera: Conjunctivae normal.  Cardiovascular:     Rate and Rhythm: Normal rate.  Pulmonary:     Effort: Pulmonary effort is normal.  Abdominal:     General: Bowel sounds are normal. There is no distension.     Palpations: Abdomen is soft. There is no mass.     Tenderness: There is no abdominal tenderness. There is no right CVA tenderness, left CVA tenderness, guarding or rebound.  Skin:    General: Skin is warm and dry.  Neurological:     General: No focal deficit present.     Mental Status: She is alert and oriented to person, place, and time.  Psychiatric:  Mood and Affect: Mood normal.        Behavior: Behavior normal.        Thought Content: Thought content normal.        Judgment: Judgment normal.     Results for orders placed or performed during the hospital encounter of 01/29/22 (from the past 24 hour(s))  POCT urine pregnancy     Status: None   Collection Time: 01/29/22  2:07 PM  Result Value Ref Range   Preg Test, Ur Negative Negative  POCT urinalysis dipstick     Status: Abnormal   Collection Time: 01/29/22  2:10 PM  Result Value Ref Range   Color, UA yellow yellow   Clarity, UA cloudy (A) clear   Glucose, UA negative negative mg/dL   Bilirubin, UA negative negative   Ketones, POC UA negative negative mg/dL    Spec Grav, UA 1.015 1.010 - 1.025   Blood, UA trace-intact (A) negative   pH, UA 6.0 5.0 - 8.0   Protein Ur, POC negative negative mg/dL   Urobilinogen, UA 0.2 0.2 or 1.0 E.U./dL   Nitrite, UA Negative Negative   Leukocytes, UA Negative Negative    Assessment and Plan :   PDMP not reviewed this encounter.  1. Malodorous urine   2. Amenorrhea     Urine culture pending, recommended better hydration, limit the caffeine to 12 ounces daily.  No signs of an acute gynecologic emergency.  Follow-up and establish care with a new gynecologist. Counseled patient on potential for adverse effects with medications prescribed/recommended today, ER and return-to-clinic precautions discussed, patient verbalized understanding.    Jaynee Eagles, PA-C 01/29/22 1425

## 2022-01-29 NOTE — Discharge Instructions (Addendum)
Make sure you hydrate very well with plain water and a quantity of 80 ounces of water a day (5 standard 16 ounce bottles of water).  Please limit drinks that are considered urinary irritants such as soda, sweet tea, coffee, energy drinks, alcohol.  These can worsen your urinary and genital symptoms but also be the source of them.  I will let you know about your urine culture results through MyChart to see if we need to prescribe or change your antibiotics based off of those results.  Reach out to and establish care with a new gynecologist at the [facility] center.

## 2022-01-30 LAB — URINE CULTURE: Culture: 30000 — AB

## 2022-03-20 ENCOUNTER — Ambulatory Visit
Admission: EM | Admit: 2022-03-20 | Discharge: 2022-03-20 | Disposition: A | Discharge status: Home / Self Care | Payer: Medicaid Other | Attending: Urgent Care | Admitting: Urgent Care

## 2022-03-20 DIAGNOSIS — R112 Nausea with vomiting, unspecified: Secondary | ICD-10-CM

## 2022-03-20 DIAGNOSIS — Z3201 Encounter for pregnancy test, result positive: Secondary | ICD-10-CM | POA: Diagnosis not present

## 2022-03-20 DIAGNOSIS — N912 Amenorrhea, unspecified: Secondary | ICD-10-CM | POA: Diagnosis not present

## 2022-03-20 DIAGNOSIS — O219 Vomiting of pregnancy, unspecified: Secondary | ICD-10-CM

## 2022-03-20 DIAGNOSIS — Z3491 Encounter for supervision of normal pregnancy, unspecified, first trimester: Secondary | ICD-10-CM

## 2022-03-20 LAB — POCT URINE PREGNANCY: Preg Test, Ur: POSITIVE — AB

## 2022-03-20 MED ORDER — DOXYLAMINE-PYRIDOXINE 10-10 MG PO TBEC: 1.0000 | DELAYED_RELEASE_TABLET | Freq: Two times a day (BID) | ORAL | 0 refills | Status: DC

## 2022-03-20 NOTE — ED Provider Notes (Signed)
[facility] URG[facility]  Note:  This document was prepared using Systems analyst and may include unintentional dictation errors.  MRN: [id number] DOB: 1995-07-08  Subjective:   Michele Fox is a 27 y.o. female presenting for pregnancy test.  Patient has had amenorrhea.  LMP was 01/28/2022.  Has had nausea without vomiting.  Denies fever, n/v, abdominal pain, pelvic pain, rashes, dysuria, urinary frequency, hematuria, vaginal discharge.  Plans on following up with her OB/GYN as soon as possible.  No current facility-administered medications for this encounter.  Current Outpatient Medications:    cetirizine (ZYRTEC) 10 MG tablet, Take 1 tablet (10 mg total) by mouth daily., Disp: 90 tablet, Rfl: 0   montelukast (SINGULAIR) 5 MG chewable tablet, Chew 2 tablets (10 mg total) by mouth at bedtime., Disp: 180 tablet, Rfl: 0   Prenatal Vit-Fe Fumarate-FA (PRENATAL MULTIVITAMIN) TABS tablet, Take 1 tablet by mouth daily at 12 noon., Disp: , Rfl:    pseudoephedrine (SUDAFED) 30 MG tablet, Take 1-2 tablets (30-60 mg total) by mouth every 6 (six) hours as needed for congestion., Disp: 30 tablet, Rfl: 0   No Known Allergies  History reviewed. No pertinent past medical history.   Past Surgical History:  Procedure Laterality Date   UTERINE SEPTUM RESECTION N/A 08/17/2018   Procedure: REVERSAL OF UTERINE INVERSION WITH PLACEMENT BAKRI BALLOON;  Surgeon: Jonnie Kind, MD;  Location: [place];  Service: Gynecology;  Laterality: N/A;    History reviewed. No pertinent family history.  Social History   Tobacco Use   Smoking status: Never   Smokeless tobacco: Never  Vaping Use   Vaping Use: Never used  Substance Use Topics   Alcohol use: Yes    Comment: occ   Drug use: Never    ROS   Objective:   Vitals: BP 135/83 (BP Location: Right Arm)   Pulse 79   Temp 98.2 F (36.8 C) (Oral)   Resp 20   LMP 01/28/2022   SpO2 98%   Physical  Exam Constitutional:      General: She is not in acute distress.    Appearance: Normal appearance. She is well-developed. She is not ill-appearing, toxic-appearing or diaphoretic.  HENT:     Head: Normocephalic and atraumatic.     Nose: Nose normal.     Mouth/Throat:     Mouth: Mucous membranes are moist.  Eyes:     General: No scleral icterus.       Right eye: No discharge.        Left eye: No discharge.     Extraocular Movements: Extraocular movements intact.  Cardiovascular:     Rate and Rhythm: Normal rate.  Pulmonary:     Effort: Pulmonary effort is normal.  Skin:    General: Skin is warm and dry.  Neurological:     General: No focal deficit present.     Mental Status: She is alert and oriented to person, place, and time.  Psychiatric:        Mood and Affect: Mood normal.        Behavior: Behavior normal.     Results for orders placed or performed during the hospital encounter of 03/20/22 (from the past 24 hour(s))  POCT urine pregnancy     Status: Abnormal   Collection Time: 03/20/22 10:48 AM  Result Value Ref Range   Preg Test, Ur Positive (A) Negative    Assessment and Plan :   PDMP not reviewed this encounter.  1. First  trimester pregnancy   2. Positive urine pregnancy test   3. Amenorrhea     Started to use to help with nausea in pregnancy.  Start prenatal vitamin.  General counseling provided including avoidance of alcohol and cigarettes, caffeine.  Counseled on medications generally safe in pregnancy.  Follow-up with OB/GYN.  Discussed ER precautions.   Jaynee Eagles, [place] 03/20/22 [phone]

## 2022-03-20 NOTE — ED Triage Notes (Addendum)
Pt reports she took a home preg test this morning  and it was positive.   Pt reports she is nauseas right now.

## 2022-04-04 ENCOUNTER — Other Ambulatory Visit (HOSPITAL_COMMUNITY)
Admission: RE | Admit: 2022-04-04 | Discharge: 2022-04-04 | Disposition: A | Discharge status: Home / Self Care | Payer: Medicaid Other | Source: Ambulatory Visit | Attending: Obstetrics & Gynecology | Admitting: Obstetrics & Gynecology

## 2022-04-04 ENCOUNTER — Ambulatory Visit (INDEPENDENT_AMBULATORY_CARE_PROVIDER_SITE_OTHER): Payer: Medicaid Other

## 2022-04-04 VITALS — BP 116/71 | HR 73 | Ht 62.0 in | Wt 252.3 lb

## 2022-04-04 DIAGNOSIS — Z3481 Encounter for supervision of other normal pregnancy, first trimester: Secondary | ICD-10-CM | POA: Diagnosis not present

## 2022-04-04 DIAGNOSIS — Z3480 Encounter for supervision of other normal pregnancy, unspecified trimester: Secondary | ICD-10-CM | POA: Diagnosis present

## 2022-04-04 DIAGNOSIS — Z1339 Encounter for screening examination for other mental health and behavioral disorders: Secondary | ICD-10-CM | POA: Diagnosis not present

## 2022-04-04 DIAGNOSIS — O3680X0 Pregnancy with inconclusive fetal viability, not applicable or unspecified: Secondary | ICD-10-CM

## 2022-04-04 DIAGNOSIS — Z3A08 8 weeks gestation of pregnancy: Secondary | ICD-10-CM

## 2022-04-04 DIAGNOSIS — Z3A09 9 weeks gestation of pregnancy: Secondary | ICD-10-CM

## 2022-04-04 NOTE — Progress Notes (Signed)
New OB Intake  I connected with Michele Fox  on 04/04/22 at  2:10 PM EDT by in person and verified that I am speaking with the correct person using two identifiers. Nurse is located at [facility] and pt is located at [place].  I discussed the limitations, risks, security and privacy concerns of performing an evaluation and management service by telephone and the availability of in person appointments. I also discussed with the patient that there may be a patient responsible charge related to this service. The patient expressed understanding and agreed to proceed.  I explained I am completing New OB Intake today. We discussed EDD of 11/05/22 that is based on LMP of 01/29/22. Pt is G2/P1001. I reviewed her allergies, medications, Medical/Surgical/OB history, and appropriate screenings. I informed her of [facility] services. [facility] information placed in AVS. Based on history, this is a high risk pregnancy.  Patient Active Problem List   Diagnosis Date Noted   Uterine inversion associated with pregnancy 08/16/2018   Submucous uterine fibroid 08/12/2018   Incomplete uterine prolapse 08/12/2018   Vaginal delivery 08/12/2018   Maternal anemia, with delivery 08/12/2018   Group beta Strep positive 08/12/2018   Postpartum hemorrhage 08/12/2018    Concerns addressed today  Delivery Plans Plans to deliver at [facility]. Patient given information for [facility] Healthy Baby website for more information about [facility]. Patient is not interested in water birth. Offered upcoming OB visit with CNM to discuss further.  MyChart/Babyscripts MyChart access verified. I explained pt will have some visits in office and some virtually. Babyscripts instructions given and order placed. Patient verifies receipt of registration text/e-mail. Account successfully created and app downloaded.  Blood Pressure Cuff/Weight Scale Patient has a BP cuff at home. Explained after first prenatal appt pt will check weekly  and document in 83. Patient does not have weight scale; patient may purchase if they desire to track weight weekly in Babyscripts.  Anatomy US Explained first scheduled [place] will be around 19 weeks. Dating and Viability scan performed today. Anatomy US TBD.  Labs Discussed Johnsie Cancel genetic screening with patient. Would like both Panorama and Horizon drawn at new OB visit. Routine prenatal labs needed.  COVID Vaccine Patient has had COVID vaccine.   Is patient a CenteringPregnancy candidate?  Not a Candidate Declined due to Group setting Not a candidate due to Complex coordination of care needed If accepted,    Social Determinants of Health Food Insecurity: Patient denies food insecurity. WIC Referral: Patient is interested in referral to [facility].  Transportation: Patient denies transportation needs. Childcare: Discussed no children allowed at ultrasound appointments. Offered childcare services; patient declines childcare services at this time.  Interested in [place]? If yes, send referral.   First visit review I reviewed new OB appt with patient. I explained they will have a provider visit that includes pap smear, genetic screening, and discuss plan of care for pregnancy. Explained pt will be seen by TBD at first visit; encounter routed to appropriate provider. Explained that patient will be seen by pregnancy navigator following visit with provider.   Lucianne Lei, RN 04/04/2022  2:10 PM

## 2022-04-05 LAB — CERVICOVAGINAL ANCILLARY ONLY
Bacterial Vaginitis (gardnerella): NEGATIVE
Candida Glabrata: NEGATIVE
Candida Vaginitis: POSITIVE — AB
Chlamydia: NEGATIVE
Comment: NEGATIVE
Comment: NEGATIVE
Comment: NEGATIVE
Comment: NEGATIVE
Comment: NEGATIVE
Comment: NORMAL
Neisseria Gonorrhea: NEGATIVE
Trichomonas: NEGATIVE

## 2022-04-05 LAB — CBC/D/PLT+RPR+RH+ABO+RUBIGG...
Antibody Screen: NEGATIVE
Basophils Absolute: 0 10*3/uL (ref 0.0–0.2)
Basos: 0 %
EOS (ABSOLUTE): 0 10*3/uL (ref 0.0–0.4)
Eos: 1 %
HCV Ab: NONREACTIVE
HIV Screen 4th Generation wRfx: NONREACTIVE
Hematocrit: 32.2 % — ABNORMAL LOW (ref 34.0–46.6)
Hemoglobin: 10.8 g/dL — ABNORMAL LOW (ref 11.1–15.9)
Hepatitis B Surface Ag: NEGATIVE
Immature Grans (Abs): 0 10*3/uL (ref 0.0–0.1)
Immature Granulocytes: 0 %
Lymphocytes Absolute: 2.1 10*3/uL (ref 0.7–3.1)
Lymphs: 45 %
MCH: 30.9 pg (ref 26.6–33.0)
MCHC: 33.5 g/dL (ref 31.5–35.7)
MCV: 92 fL (ref 79–97)
Monocytes Absolute: 0.6 10*3/uL (ref 0.1–0.9)
Monocytes: 13 %
Neutrophils Absolute: 1.9 10*3/uL (ref 1.4–7.0)
Neutrophils: 41 %
Platelets: 252 10*3/uL (ref 150–450)
RBC: 3.5 x10E6/uL — ABNORMAL LOW (ref 3.77–5.28)
RDW: 12.1 % (ref 11.7–15.4)
RPR Ser Ql: NONREACTIVE
Rh Factor: POSITIVE
Rubella Antibodies, IGG: 5.92 index (ref >0.99)
WBC: 4.6 10*3/uL (ref 3.4–10.8)

## 2022-04-05 LAB — HCV INTERPRETATION

## 2022-04-06 LAB — CULTURE, OB URINE

## 2022-04-06 LAB — URINE CULTURE, OB REFLEX

## 2022-04-27 ENCOUNTER — Ambulatory Visit (INDEPENDENT_AMBULATORY_CARE_PROVIDER_SITE_OTHER): Payer: Medicaid Other | Admitting: Obstetrics and Gynecology

## 2022-04-27 ENCOUNTER — Encounter: Payer: Self-pay | Admitting: Obstetrics and Gynecology

## 2022-04-27 ENCOUNTER — Other Ambulatory Visit (HOSPITAL_COMMUNITY)
Admission: RE | Admit: 2022-04-27 | Discharge: 2022-04-27 | Disposition: A | Discharge status: Home / Self Care | Payer: Medicaid Other | Source: Ambulatory Visit | Attending: Obstetrics and Gynecology | Admitting: Obstetrics and Gynecology

## 2022-04-27 VITALS — BP 124/66 | HR 75 | Wt 257.0 lb

## 2022-04-27 DIAGNOSIS — Z3490 Encounter for supervision of normal pregnancy, unspecified, unspecified trimester: Secondary | ICD-10-CM | POA: Diagnosis present

## 2022-04-27 DIAGNOSIS — O26899 Other specified pregnancy related conditions, unspecified trimester: Secondary | ICD-10-CM | POA: Diagnosis present

## 2022-04-27 DIAGNOSIS — O34599 Maternal care for other abnormalities of gravid uterus, unspecified trimester: Secondary | ICD-10-CM

## 2022-04-27 DIAGNOSIS — Z3481 Encounter for supervision of other normal pregnancy, first trimester: Secondary | ICD-10-CM

## 2022-04-27 DIAGNOSIS — Z3A12 12 weeks gestation of pregnancy: Secondary | ICD-10-CM | POA: Diagnosis not present

## 2022-04-27 DIAGNOSIS — Z3480 Encounter for supervision of other normal pregnancy, unspecified trimester: Secondary | ICD-10-CM

## 2022-04-27 DIAGNOSIS — Z1339 Encounter for screening examination for other mental health and behavioral disorders: Secondary | ICD-10-CM | POA: Diagnosis not present

## 2022-04-27 DIAGNOSIS — N898 Other specified noninflammatory disorders of vagina: Secondary | ICD-10-CM | POA: Insufficient documentation

## 2022-04-27 DIAGNOSIS — N855 Inversion of uterus: Secondary | ICD-10-CM | POA: Diagnosis not present

## 2022-04-27 LAB — POCT URINALYSIS DIPSTICK
Bilirubin, UA: NEGATIVE
Blood, UA: NEGATIVE
Glucose, UA: NEGATIVE
Ketones, UA: POSITIVE
Nitrite, UA: NEGATIVE
Protein, UA: POSITIVE — AB
Spec Grav, UA: >=1.03 — AB (ref 1.010–1.025)
Urobilinogen, UA: 0.2 E.U./dL
pH, UA: 6.5 (ref 5.0–8.0)

## 2022-04-27 MED ORDER — DOXYLAMINE-PYRIDOXINE 10-10 MG PO TBEC: 1.0000 | DELAYED_RELEASE_TABLET | Freq: Two times a day (BID) | ORAL | 6 refills | Status: DC

## 2022-04-27 NOTE — Progress Notes (Signed)
Subjective:  Michele Fox is a 27 y.o. G2P1001 at 102w4d being seen today for her first OB visit. EDD by LMP and confirmed by first trimester U/S. H/O TSVD in [id number] complicated by uterine inversion with PPH. Otherwise pt denies any chronic medical problems or medicationsongoing prenatal care.  She is currently monitored for the following issues for this high-risk pregnancy and has Uterine inversion associated with pregnancy and Supervision of other normal pregnancy, antepartum on their problem list.  Patient reports  first trimester Sx .  Contractions: Irritability. Vag. Bleeding: Scant.   . Denies leaking of fluid.   The following portions of the patient's history were reviewed and updated as appropriate: allergies, current medications, past family history, past medical history, past social history, past surgical history and problem list. Problem list updated.  Objective:   Vitals:   04/27/22 1417  BP: 124/66  Pulse: 75  Weight: 257 lb (116.6 kg)    Fetal Status:           General:  Alert, oriented and cooperative. Patient is in no acute distress.  Skin: Skin is warm and dry. No rash noted.   Cardiovascular: Normal heart rate noted  Respiratory: Normal respiratory effort, no problems with respiration noted  Abdomen: Soft, gravid, appropriate for gestational age. Pain/Pressure: Present     Pelvic:  Cervical exam performed        Extremities: Normal range of motion.  Edema: Trace  Mental Status: Normal mood and affect. Normal behavior. Normal judgment and thought content.   Urinalysis:      Assessment and Plan:  Pregnancy: G2P1001 at [redacted]w[redacted]d  1. Supervision of other normal pregnancy, antepartum Prenatal care and labs reviewed with pt Genetic testing reviewed  2. Uterine inversion associated with pregnancy Discussed with pt Recommend manual extraction for post delivery placenta.  Preterm labor symptoms and general obstetric precautions including but not limited to vaginal  bleeding, contractions, leaking of fluid and fetal movement were reviewed in detail with the patient. Please refer to After Visit Summary for other counseling recommendations.  Return in about 4 weeks (around 05/25/2022) for OB visit, face to face, MD only.   Chancy Milroy, MD

## 2022-04-27 NOTE — Progress Notes (Signed)
NOB,  c/o vaginal rash, pain 10/10 x 1 month, incontinence, frequency, clumpy, white discharge.

## 2022-04-27 NOTE — Addendum Note (Signed)
Addended by: Tamela Oddi on: 04/27/2022 04:21 PM   Modules accepted: Orders

## 2022-04-28 ENCOUNTER — Other Ambulatory Visit: Payer: Self-pay

## 2022-04-28 DIAGNOSIS — B379 Candidiasis, unspecified: Secondary | ICD-10-CM

## 2022-04-28 DIAGNOSIS — Z3480 Encounter for supervision of other normal pregnancy, unspecified trimester: Secondary | ICD-10-CM

## 2022-04-28 LAB — CERVICOVAGINAL ANCILLARY ONLY
Bacterial Vaginitis (gardnerella): NEGATIVE
Candida Glabrata: NEGATIVE
Candida Vaginitis: POSITIVE — AB
Chlamydia: NEGATIVE
Comment: NEGATIVE
Comment: NEGATIVE
Comment: NEGATIVE
Comment: NEGATIVE
Comment: NEGATIVE
Comment: NORMAL
Neisseria Gonorrhea: NEGATIVE
Trichomonas: NEGATIVE

## 2022-04-28 LAB — HEMOGLOBIN A1C
Est. average glucose Bld gHb Est-mCnc: 108 mg/dL
Hgb A1c MFr Bld: 5.4 % (ref 4.8–5.6)

## 2022-04-28 MED ORDER — TERCONAZOLE 0.4 % VA CREA: 1.0000 | TOPICAL_CREAM | Freq: Every day | VAGINAL | 0 refills | Status: DC

## 2022-04-28 MED ORDER — PRENATAL MULTIVITAMIN CH: 1.0000 | ORAL_TABLET | Freq: Every day | ORAL | 11 refills | Status: DC

## 2022-04-28 MED ORDER — VITAFUSION PRENATAL 0.18-32.5 MG PO CHEW: 2.0000 | CHEWABLE_TABLET | Freq: Every day | ORAL | 11 refills | Status: DC

## 2022-04-29 LAB — URINE CULTURE

## 2022-05-01 ENCOUNTER — Other Ambulatory Visit: Payer: Self-pay | Admitting: *Deleted

## 2022-05-01 DIAGNOSIS — Z3480 Encounter for supervision of other normal pregnancy, unspecified trimester: Secondary | ICD-10-CM

## 2022-05-01 LAB — CYTOLOGY - PAP
Comment: NEGATIVE
Diagnosis: NEGATIVE
High risk HPV: NEGATIVE

## 2022-05-01 MED ORDER — COMPLETENATE 29-1 MG PO CHEW: 1.0000 | CHEWABLE_TABLET | Freq: Every day | ORAL | 11 refills | Status: DC

## 2022-05-02 ENCOUNTER — Inpatient Hospital Stay (HOSPITAL_COMMUNITY)
Admission: AD | Admit: 2022-05-02 | Discharge: 2022-05-02 | Discharge status: Against Medical Advice | Payer: Medicaid Other | Attending: Obstetrics and Gynecology | Admitting: Obstetrics and Gynecology

## 2022-05-02 ENCOUNTER — Encounter (HOSPITAL_COMMUNITY): Payer: Self-pay | Admitting: Obstetrics and Gynecology

## 2022-05-02 ENCOUNTER — Other Ambulatory Visit: Payer: Self-pay | Admitting: *Deleted

## 2022-05-02 DIAGNOSIS — K920 Hematemesis: Secondary | ICD-10-CM | POA: Diagnosis present

## 2022-05-02 DIAGNOSIS — Z5321 Procedure and treatment not carried out due to patient leaving prior to being seen by health care provider: Secondary | ICD-10-CM | POA: Diagnosis not present

## 2022-05-02 DIAGNOSIS — Z3480 Encounter for supervision of other normal pregnancy, unspecified trimester: Secondary | ICD-10-CM

## 2022-05-02 LAB — URINALYSIS, ROUTINE W REFLEX MICROSCOPIC
Bilirubin Urine: NEGATIVE
Glucose, UA: NEGATIVE mg/dL
Hgb urine dipstick: NEGATIVE
Ketones, ur: 80 mg/dL — AB
Leukocytes,Ua: NEGATIVE
Nitrite: NEGATIVE
Protein, ur: NEGATIVE mg/dL
Specific Gravity, Urine: 1.028 (ref 1.005–1.030)
pH: 5 (ref 5.0–8.0)

## 2022-05-02 MED ORDER — ONDANSETRON HCL 4 MG/2ML IJ SOLN: 4.0000 mg | Freq: Once | INTRAMUSCULAR | Status: DC

## 2022-05-02 MED ORDER — WESTAB PLUS 27-1 MG PO TABS: 1.0000 | ORAL_TABLET | Freq: Every day | ORAL | 11 refills | Status: DC | PRN

## 2022-05-02 MED ORDER — LIDOCAINE VISCOUS HCL 2 % MT SOLN
15.0000 mL | Freq: Once | OROMUCOSAL | Status: AC
  Administered 2022-05-02: 15 mL via ORAL
  Filled 2022-05-02: qty 15

## 2022-05-02 MED ORDER — LACTATED RINGERS IV BOLUS: 1000.0000 mL | Freq: Once | INTRAVENOUS | Status: DC

## 2022-05-02 MED ORDER — ALUM & MAG HYDROXIDE-SIMETH 200-200-20 MG/5ML PO SUSP
30.0000 mL | Freq: Once | ORAL | Status: AC
  Administered 2022-05-02: 30 mL via ORAL
  Filled 2022-05-02: qty 30

## 2022-05-02 NOTE — MAU Provider Note (Signed)
History     CSN: [id number]  Arrival date and time: 05/02/22 1912   Event Date/Time   First Provider Initiated Contact with Patient 05/02/22 2200      Chief Complaint  Patient presents with   Emesis During Pregnancy   Michele Fox , a  27 y.o. G2P1001 at 7524w2d presents to MAU with complaints of bloody emesis that started today. Patient states she has been throwing up for weeks. She states she typically throws up 3 times per day. She states that when she woke up from her nap she threw up 3 times and the 2nd and 3 had blood in it. She describes the blood as "dark red" and "a lot". She states she called EMS and EMS said "less than a teaspoon." She states since arrival to MAU she has only thrown up 1 time and states it was more just "spotty and streaky, than "a lot like before."" She states while she was throwing up she also had some upper right sided pain that radiated around to her back and lasted about 45 mins. She currently denies pain. States "she just feels uncomfortable and queasy." Denies problems with constipation and diarrhea.  She denies vaginal bleeding, leaking of fluid and contractions.          OB History     Gravida  2   Para  1   Term  1   Preterm      AB      Living  1      SAB      IAB      Ectopic      Multiple  0   Live Births  1           Past Medical History:  Diagnosis Date   Group beta Strep positive 08/12/2018   Incomplete uterine prolapse 08/12/2018   With delivery   Postpartum hemorrhage 08/12/2018   Uterine inversion associated with pregnancy 08/16/2018    Past Surgical History:  Procedure Laterality Date   UTERINE SEPTUM RESECTION N/A 08/17/2018   Procedure: REVERSAL OF UTERINE INVERSION WITH PLACEMENT BAKRI BALLOON;  Surgeon: Tilda BurrowFerguson, John V, MD;  [facility];  Service: Gynecology;  Laterality: N/A;    Family History  Problem Relation Age of Onset   Hypertension Mother    Gout Mother    Diabetes Father     Diabetes Paternal Grandmother     Social History   Tobacco Use   Smoking status: Never   Smokeless tobacco: Never  Vaping Use   Vaping Use: Never used  Substance Use Topics   Alcohol use: Not Currently    Comment: occ, prior to pregnancy   Drug use: Never    Allergies:  Allergies  Allergen Reactions   Penicillins Rash    Medications Prior to Admission  Medication Sig Dispense Refill Last Dose   Doxylamine-Pyridoxine (DICLEGIS) 10-10 MG TBEC Take 1 tablet by mouth 2 (two) times daily. 60 tablet 6 05/01/2022   Prenatal Vit-Fe Fumarate-FA (WESTAB PLUS) 27-1 MG TABS Take 1 tablet by mouth daily as needed. 30 tablet 11 05/01/2022   pseudoephedrine (SUDAFED) 30 MG tablet Take 1-2 tablets (30-60 mg total) by mouth every 6 (six) hours as needed for congestion. 30 tablet 0 Past Month   terconazole (TERAZOL 7) 0.4 % vaginal cream Place 1 applicator vaginally at bedtime. 45 g 0 05/01/2022   cetirizine (ZYRTEC) 10 MG tablet Take 1 tablet (10 mg total) by mouth daily. (Patient not taking: Reported on  05/02/2022) 90 tablet 0 Not Taking   montelukast (SINGULAIR) 5 MG chewable tablet Chew 2 tablets (10 mg total) by mouth at bedtime. (Patient not taking: Reported on 05/02/2022) 180 tablet 0 Not Taking   Prenatal MV-Min-FA-Omega-3 (VITAFUSION PRENATAL) 0.18-32.5 MG CHEW Chew 2 tablets by mouth daily. (Patient not taking: Reported on 05/02/2022) 60 tablet 11 Not Taking   Prenatal Vit-Fe Fumarate-FA (PRENATAL MULTIVITAMIN) TABS tablet Take 1 tablet by mouth daily at 12 noon. (Patient not taking: Reported on 05/02/2022) 30 tablet 11 Not Taking   prenatal vitamin w/FE, FA (NATACHEW) 29-1 MG CHEW chewable tablet Chew 1 tablet by mouth daily at 12 noon. (Patient not taking: Reported on 05/02/2022) 30 tablet 11 Not Taking    Review of Systems  Constitutional:  Negative for chills, fatigue and fever.  Eyes:  Negative for pain and visual disturbance.  Respiratory:  Negative for apnea, shortness of breath and wheezing.    Cardiovascular:  Negative for chest pain and palpitations.  Gastrointestinal:  Positive for nausea and vomiting. Negative for abdominal pain, constipation and diarrhea.  Genitourinary:  Negative for difficulty urinating, dysuria, pelvic pain, vaginal bleeding, vaginal discharge and vaginal pain.  Musculoskeletal:  Negative for back pain.  Neurological:  Negative for seizures, weakness and headaches.  Psychiatric/Behavioral:  Negative for suicidal ideas.    Physical Exam   Blood pressure (!) 126/90, pulse 92, temperature 98.4 F (36.9 C), resp. rate 17, height 5\' 2"  (1.575 m), weight 115.2 kg, last menstrual period 01/29/2022, SpO2 100 %.  Physical Exam Vitals and nursing note reviewed.  Constitutional:      General: She is not in acute distress.    Appearance: Normal appearance.  HENT:     Head: Normocephalic.  Cardiovascular:     Rate and Rhythm: Normal rate.  Pulmonary:     Effort: Pulmonary effort is normal.  Abdominal:     General: There is no distension.     Palpations: Abdomen is soft.     Tenderness: There is no abdominal tenderness. There is no guarding.  Musculoskeletal:     Cervical back: Normal range of motion.  Skin:    General: Skin is warm and dry.     Capillary Refill: Capillary refill takes 2 to 3 seconds.  Neurological:     Mental Status: She is alert and oriented to person, place, and time.  Psychiatric:        Mood and Affect: Mood normal.    FHT obtained in triage   MAU Course  Procedures Orders Placed This Encounter  Procedures   Urinalysis, Routine w reflex microscopic -Urine, Clean Catch   Amylase   Lipase, blood   Comprehensive metabolic panel   Meds ordered this encounter  Medications   AND Linked Order Group    alum & mag hydroxide-simeth (MAALOX/MYLANTA) 200-200-20 MG/5ML suspension 30 mL    lidocaine (XYLOCAINE) 2 % viscous mouth solution 15 mL     MDM - Patient not actively vomiting. Will PO challenge.  - @ 2255 patient called  out, reports vomited again. CNM observed moderate amount of light red emesis with food particles.  - Given 80 if ketones, IV bolus ordered.  - @ 2300 RN came to [facility] and patient desires to go home.  - CNM to patient bedside, and patient would like to leave. CNM counseled patient on getting treatment and CNM recommended that patient stay. She would like to leave.   Assessment and Plan  - patient left AMA.   Claudette Head,  MSN CNM  05/02/2022, 10:00 PM

## 2022-05-02 NOTE — Progress Notes (Signed)
TC. Pt advised of results. Picked up RX and has started [place]. No concerns. Reports PNV sent in is not covered by Medicaid/ or available at [facility]. 4th PNV RX sent-[facility]- per pharmacy this is available and covered.

## 2022-05-02 NOTE — MAU Note (Signed)
Phlebotomist at bedside to draw labs when RN noticed that provider had added orders for ivf's. RN went to room to let pt know that we were going to do an iv and meds and pt states she is tired and she is just going to go home. RN asked pt to clarify and pt again states she is going to go home. States she is tired and she is going to leave. Provider made aware.

## 2022-05-02 NOTE — MAU Note (Signed)
.  Michele Fox is a 27 y.o. at [redacted]w[redacted]d here in MAU reporting blood in her emesis about 1600. Blood was dark colored. Vomiting is not new but this is the first time I've seen blood. Occ cramping in upper abdomen. No vag bleeding  Onset of complaint: 1600 Pain score: 7 Vitals:   05/02/22 1955 05/02/22 1958  BP:  (!) 144/63  Pulse: 66   Resp: 17   Temp: 98.4 F (36.9 C)   SpO2: 100%      FHT:159 Lab orders placed from triage:  u/a

## 2022-05-03 ENCOUNTER — Other Ambulatory Visit: Payer: Self-pay | Admitting: *Deleted

## 2022-05-03 DIAGNOSIS — O219 Vomiting of pregnancy, unspecified: Secondary | ICD-10-CM

## 2022-05-03 MED ORDER — PROMETHAZINE HCL 25 MG PO TABS
25.0000 mg | ORAL_TABLET | Freq: Four times a day (QID) | ORAL | 1 refills | Status: DC | PRN
Start: 2022-05-03 — End: 2022-09-03

## 2022-05-03 NOTE — Progress Notes (Signed)
TC from pt reporting N/V X 3 this AM. Bloody streaks noted on 3rd time. Pt vomited after drinking water and taking Diclegis. Reassured pt that small amts of blood in vomit can be from irritation of the throat or esophagus and that blood loss is only concerning if it is a large amt or the pt experiences dizziness or racing HR or severe abdominal pain. RX phenergan sent for pt. Pt encouraged to try eating a cracker or dry toast and and drinking a small amt of water with phenergan. Advised pt to try to eat and drink small amts frequently throughout the day. Advised to seek care in MAU if she is unable to keep food, fluids, or nausea meds down for a prolonged period today. Pt previously declined IV hydration last night. Encouraged that she may still need this if she can't keep food, fluids, or meds down.

## 2022-05-06 LAB — PANORAMA PRENATAL TEST FULL PANEL:PANORAMA TEST PLUS 5 ADDITIONAL MICRODELETIONS: FETAL FRACTION: 17

## 2022-05-08 LAB — HORIZON CUSTOM: REPORT SUMMARY: NEGATIVE

## 2022-05-25 ENCOUNTER — Institutional Professional Consult (permissible substitution): Payer: Self-pay | Admitting: Licensed Clinical Social Worker

## 2022-05-25 ENCOUNTER — Encounter: Payer: Medicaid Other | Admitting: Obstetrics and Gynecology

## 2022-05-30 ENCOUNTER — Ambulatory Visit (INDEPENDENT_AMBULATORY_CARE_PROVIDER_SITE_OTHER): Payer: Medicaid Other | Admitting: Obstetrics

## 2022-05-30 ENCOUNTER — Ambulatory Visit (INDEPENDENT_AMBULATORY_CARE_PROVIDER_SITE_OTHER): Payer: Medicaid Other | Admitting: Licensed Clinical Social Worker

## 2022-05-30 ENCOUNTER — Encounter: Payer: Self-pay | Admitting: Obstetrics

## 2022-05-30 VITALS — BP 107/69 | HR 71 | Wt 248.6 lb

## 2022-05-30 DIAGNOSIS — F4320 Adjustment disorder, unspecified: Secondary | ICD-10-CM | POA: Diagnosis not present

## 2022-05-30 DIAGNOSIS — Z3480 Encounter for supervision of other normal pregnancy, unspecified trimester: Secondary | ICD-10-CM

## 2022-05-30 DIAGNOSIS — J301 Allergic rhinitis due to pollen: Secondary | ICD-10-CM

## 2022-05-30 MED ORDER — CETIRIZINE HCL 10 MG PO TABS
10.0000 mg | ORAL_TABLET | Freq: Every day | ORAL | 11 refills | Status: DC
Start: 2022-05-30 — End: 2023-08-22

## 2022-05-30 NOTE — Progress Notes (Signed)
Subjective:  Michele Fox is a 27 y.o. G2P1001 at [redacted]w[redacted]d being seen today for ongoing prenatal care.  She is currently monitored for the following issues for this low-risk pregnancy and has Uterine inversion associated with pregnancy and Supervision of other normal pregnancy, antepartum on their problem list.  Patient reports no complaints.  Contractions: Not present. Vag. Bleeding: None.  Movement: Present. Denies leaking of fluid.   The following portions of the patient's history were reviewed and updated as appropriate: allergies, current medications, past family history, past medical history, past social history, past surgical history and problem list. Problem list updated.  Objective:   Vitals:   05/30/22 1443  BP: 107/69  Pulse: 71  Weight: 248 lb 9.6 oz (112.8 kg)    Fetal Status: Fetal Heart Rate (bpm): 152   Movement: Present     General:  Alert, oriented and cooperative. Patient is in no acute distress.  Skin: Skin is warm and dry. No rash noted.   Cardiovascular: Normal heart rate noted  Respiratory: Normal respiratory effort, no problems with respiration noted  Abdomen: Soft, gravid, appropriate for gestational age. Pain/Pressure: Absent     Pelvic:  Cervical exam deferred        Extremities: Normal range of motion.  Edema: None  Mental Status: Normal mood and affect. Normal behavior. Normal judgment and thought content.   Urinalysis:      Assessment and Plan:  Pregnancy: G2P1001 at [redacted]w[redacted]d  1. Supervision of other normal pregnancy, antepartum Rx: - AFP, Serum, Open Spina Bifida  2. Seasonal allergic rhinitis due to pollen Rx: - cetirizine (ZYRTEC ALLERGY) 10 MG tablet; Take 1 tablet (10 mg total) by mouth daily.  Dispense: 30 tablet; Refill: 11  Preterm labor symptoms and general obstetric precautions including but not limited to vaginal bleeding, contractions, leaking of fluid and fetal movement were reviewed in detail with the patient. Please refer to After  Visit Summary for other counseling recommendations.   Return in about 4 weeks (around 06/27/2022) for ROB.   Brock Bad, MD 05/30/22

## 2022-05-30 NOTE — Progress Notes (Signed)
Pt reports fetal movement, denies pain.  

## 2022-06-01 LAB — AFP, SERUM, OPEN SPINA BIFIDA
AFP MoM: 0.86
AFP Value: 27.1 ng/mL
Gest. Age on Collection Date: 17 weeks
Maternal Age At EDD: 24.9 yr
OSBR Risk 1 IN: 10000
Test Results:: NEGATIVE
Weight: 248 [lb_av]

## 2022-06-05 NOTE — BH Specialist Note (Signed)
Integrated Behavioral Health Initial In-Person Visit  MRN: [id number] Name: Michele Fox  Number of Integrated Behavioral Health Clinician visits: 1 Session Start time:   3:18pm Session End time: 3:52pm Total time in minutes: 34 mins in person at [facility]   Types of Service: Individual psychotherapy  Interpretor:No. Interpretor Name and Language: none   Warm Hand Off Completed.        Subjective: Michele Fox is a 27 y.o. female accompanied by Father of child Patient was referred by Dr. Clearance Coots for positive depression screen. Patient reports the following symptoms/concerns: difficulty sleeping, depress mood, fatigue  Duration of problem: approx two months ; Severity of problem: mild  Objective: Mood: Depressed and Affect: Appropriate Risk of harm to self or others: No plan to harm self or others  Life Context: Family and Social: Lives with partner in [place]  School/Work: n/a Self-Care: n/a Life Changes: New pregnancy  Patient and/or Family's Strengths/Protective Factors: Concrete supports in place (healthy food, safe environments, etc.)  Goals Addressed: Patient will: Reduce symptoms of: adjustment disorder  Increase knowledge and/or ability of: coping skills  Demonstrate ability to: Increase healthy adjustment to current life circumstances  Progress towards Goals: Ongoing  Interventions: Interventions utilized: Supportive Counseling  Standardized Assessments completed: PHQ 9  Patient and/or Family Response: Michele Fox reports concerns adjusting to pregnancy. Michele Fox reports father of baby is supportive however transportation is a stressor.     Assessment: Patient currently experiencing adjustment disorder.   Patient may benefit from integrated behavioral health.  Plan: Follow up with behavioral health clinician on : 06/27/2022 Behavioral recommendations: prioritize rest, communicate needs with partner for added support, engage in self  care, communicate and collaborate with provider  Referral(s): Integrated [employer] (In Clinic) "From scale of 1-10, how likely are you to follow plan?":    Gwyndolyn Saxon, LCSW

## 2022-06-12 DIAGNOSIS — O99210 Obesity complicating pregnancy, unspecified trimester: Secondary | ICD-10-CM | POA: Insufficient documentation

## 2022-06-13 ENCOUNTER — Ambulatory Visit: Payer: Medicaid Other | Admitting: *Deleted

## 2022-06-13 ENCOUNTER — Ambulatory Visit: Payer: Medicaid Other | Attending: Obstetrics and Gynecology

## 2022-06-13 ENCOUNTER — Other Ambulatory Visit: Payer: Self-pay | Admitting: *Deleted

## 2022-06-13 VITALS — BP 115/51 | HR 81

## 2022-06-13 DIAGNOSIS — O99210 Obesity complicating pregnancy, unspecified trimester: Secondary | ICD-10-CM

## 2022-06-13 DIAGNOSIS — Z363 Encounter for antenatal screening for malformations: Secondary | ICD-10-CM | POA: Diagnosis not present

## 2022-06-13 DIAGNOSIS — O99212 Obesity complicating pregnancy, second trimester: Secondary | ICD-10-CM | POA: Diagnosis present

## 2022-06-13 DIAGNOSIS — Z3480 Encounter for supervision of other normal pregnancy, unspecified trimester: Secondary | ICD-10-CM

## 2022-06-13 DIAGNOSIS — Z3A19 19 weeks gestation of pregnancy: Secondary | ICD-10-CM | POA: Insufficient documentation

## 2022-06-13 DIAGNOSIS — Z8742 Personal history of other diseases of the female genital tract: Secondary | ICD-10-CM

## 2022-06-18 ENCOUNTER — Telehealth: Payer: Medicaid Other

## 2022-06-27 ENCOUNTER — Encounter: Payer: Medicaid Other | Admitting: Advanced Practice Midwife

## 2022-07-04 ENCOUNTER — Encounter: Payer: Medicaid Other | Admitting: Obstetrics

## 2022-07-05 ENCOUNTER — Encounter: Payer: Medicaid Other | Admitting: Obstetrics

## 2022-07-10 ENCOUNTER — Ambulatory Visit (INDEPENDENT_AMBULATORY_CARE_PROVIDER_SITE_OTHER): Payer: Medicaid Other | Admitting: Obstetrics

## 2022-07-10 VITALS — BP 109/72 | HR 72 | Wt 250.0 lb

## 2022-07-10 DIAGNOSIS — K047 Periapical abscess without sinus: Secondary | ICD-10-CM

## 2022-07-10 DIAGNOSIS — B379 Candidiasis, unspecified: Secondary | ICD-10-CM

## 2022-07-10 DIAGNOSIS — O99210 Obesity complicating pregnancy, unspecified trimester: Secondary | ICD-10-CM

## 2022-07-10 DIAGNOSIS — E6609 Other obesity due to excess calories: Secondary | ICD-10-CM

## 2022-07-10 DIAGNOSIS — Z3480 Encounter for supervision of other normal pregnancy, unspecified trimester: Secondary | ICD-10-CM

## 2022-07-10 MED ORDER — CLINDAMYCIN HCL 300 MG PO CAPS
300.0000 mg | ORAL_CAPSULE | Freq: Four times a day (QID) | ORAL | 0 refills | Status: DC
Start: 2022-07-10 — End: 2022-09-03

## 2022-07-10 MED ORDER — TERCONAZOLE 0.4 % VA CREA
1.0000 | TOPICAL_CREAM | Freq: Every day | VAGINAL | 2 refills | Status: DC
Start: 2022-07-10 — End: 2022-09-03

## 2022-07-10 NOTE — Progress Notes (Signed)
Subjective:  Michele Fox is a 27 y.o. G2P1001 at [redacted]w[redacted]d being seen today for ongoing prenatal care.  She is currently monitored for the following issues for this low-risk pregnancy and has Uterine inversion associated with pregnancy; Supervision of other normal pregnancy, antepartum; and Obesity affecting pregnancy on their problem list.  Patient reports toothache  Contractions: Not present. Vag. Bleeding: None.  Movement: Present. Denies leaking of fluid.   The following portions of the patient's history were reviewed and updated as appropriate: allergies, current medications, past family history, past medical history, past social history, past surgical history and problem list. Problem list updated.  Objective:   Vitals:   07/10/22 1436  Weight: 250 lb (113.4 kg)    Fetal Status:     Movement: Present     General:  Alert, oriented and cooperative. Patient is in no acute distress.  Skin: Skin is warm and dry. No rash noted.   Cardiovascular: Normal heart rate noted  Respiratory: Normal respiratory effort, no problems with respiration noted  Abdomen: Soft, gravid, appropriate for gestational age. Pain/Pressure: Present     Pelvic:  Cervical exam deferred        Extremities: Normal range of motion.  Edema: Mild pitting, slight indentation  Mental Status: Normal mood and affect. Normal behavior. Normal judgment and thought content.   Urinalysis:      Assessment and Plan:  Pregnancy: G2P1001 at [redacted]w[redacted]d  1. Supervision of other normal pregnancy, antepartum  2. Tooth abscess Rx: - clindamycin (CLEOCIN) 300 MG capsule; Take 1 capsule (300 mg total) by mouth 4 (four) times daily.  Dispense: 28 capsule; Refill: 0  3. Candida albicans infection Rx: - terconazole (TERAZOL 7) 0.4 % vaginal cream; Place 1 applicator vaginally at bedtime.  Dispense: 45 g; Refill: 2  4. Other obesity due to excess calories affecting pregnancy, antepartum    Preterm labor symptoms and general obstetric  precautions including but not limited to vaginal bleeding, contractions, leaking of fluid and fetal movement were reviewed in detail with the patient. Please refer to After Visit Summary for other counseling recommendations.   Return in about 4 weeks (around 08/07/2022) for ROB.   Brock Bad, MD 07/10/2022 2:41 PM

## 2022-07-10 NOTE — Progress Notes (Addendum)
Pt presents for ROB.  Has vaginal discharge, that is chunky and white with discomfort and itching. Desires more medication. Having tooth pain that is contributing to headaches- Requests dental letter for dental care. Printed and handed to patient today.

## 2022-07-18 ENCOUNTER — Institutional Professional Consult (permissible substitution): Payer: Medicaid Other | Admitting: Plastic Surgery

## 2022-08-07 ENCOUNTER — Encounter: Payer: Self-pay | Admitting: Student

## 2022-08-07 ENCOUNTER — Ambulatory Visit (INDEPENDENT_AMBULATORY_CARE_PROVIDER_SITE_OTHER): Payer: Medicaid Other | Admitting: Student

## 2022-08-07 VITALS — BP 118/56 | HR 78 | Wt 254.9 lb

## 2022-08-07 DIAGNOSIS — Z3A27 27 weeks gestation of pregnancy: Secondary | ICD-10-CM

## 2022-08-07 DIAGNOSIS — E6609 Other obesity due to excess calories: Secondary | ICD-10-CM

## 2022-08-07 DIAGNOSIS — Z3480 Encounter for supervision of other normal pregnancy, unspecified trimester: Secondary | ICD-10-CM

## 2022-08-07 DIAGNOSIS — O99210 Obesity complicating pregnancy, unspecified trimester: Secondary | ICD-10-CM

## 2022-08-07 NOTE — Progress Notes (Signed)
Pt presents for ROB visit. No concerns.  

## 2022-08-07 NOTE — Progress Notes (Signed)
   PRENATAL VISIT NOTE  Subjective:  Michele Fox is a 27 y.o. G2P1001 at [redacted]w[redacted]d being seen today for ongoing prenatal care.  She is currently monitored for the following issues for this low-risk pregnancy and has Uterine inversion associated with pregnancy; Supervision of other normal pregnancy, antepartum; and Obesity affecting pregnancy on their problem list.  Patient reports no complaints.  Contractions: Not present. Vag. Bleeding: None.  Movement: Present. Denies leaking of fluid.   The following portions of the patient's history were reviewed and updated as appropriate: allergies, current medications, past family history, past medical history, past social history, past surgical history and problem list.   Objective:   Vitals:   08/07/22 1347  BP: (!) 118/56  Pulse: 78  Weight: 254 lb 14.4 oz (115.6 kg)    Fetal Status: Fetal Heart Rate (bpm): 144   Movement: Present     General:  Alert, oriented and cooperative. Patient is in no acute distress.  Skin: Skin is warm and dry. No rash noted.   Cardiovascular: Normal heart rate noted  Respiratory: Normal respiratory effort, no problems with respiration noted  Abdomen: Soft, gravid, appropriate for gestational age.  Pain/Pressure: Present     Pelvic: Cervical exam deferred        Extremities: Normal range of motion.  Edema: Trace  Mental Status: Normal mood and affect. Normal behavior. Normal judgment and thought content.   Assessment and Plan:  Pregnancy: G2P1001 at [redacted]w[redacted]d 1. Supervision of other normal pregnancy, antepartum - reports vigorous and frequent fetal movement   2. [redacted] weeks gestation of pregnancy - Plan for gtt next week and follow-up visit in 2 weeks - Glucose Tolerance, 2 Hours w/1 Hour; Future - RPR; Future - HIV antibody (with reflex); Future - CBC; Future  3. Other obesity due to excess calories affecting pregnancy, antepartum - plan repeat growth at 28, 32 and 36 weeks - intitiate weekly testing at 34  weeks.  Preterm labor symptoms and general obstetric precautions including but not limited to vaginal bleeding, contractions, leaking of fluid and fetal movement were reviewed in detail with the patient. Please refer to After Visit Summary for other counseling recommendations.   Return in about 2 weeks (around 08/21/2022) for IN-PERSON, LOB; GTT LAB only in one week.  Future Appointments  Date Time Provider Department Center  08/14/2022  8:45 AM [facility] LAB [facility] None  08/15/2022  2:30 PM [facility]URSE [facility]  08/15/2022  2:45 PM [facility] US[facility]  08/21/2022  2:50 PM Lennart Pall, MD [facility] None    Corlis Hove, NP

## 2022-08-14 ENCOUNTER — Other Ambulatory Visit: Payer: Medicaid Other

## 2022-08-15 ENCOUNTER — Ambulatory Visit: Payer: Medicaid Other

## 2022-08-21 ENCOUNTER — Encounter: Payer: Medicaid Other | Admitting: Obstetrics and Gynecology

## 2022-08-28 ENCOUNTER — Other Ambulatory Visit: Payer: Medicaid Other

## 2022-08-29 ENCOUNTER — Ambulatory Visit (HOSPITAL_BASED_OUTPATIENT_CLINIC_OR_DEPARTMENT_OTHER): Payer: Medicaid Other

## 2022-08-29 ENCOUNTER — Ambulatory Visit: Payer: Medicaid Other | Admitting: *Deleted

## 2022-08-29 ENCOUNTER — Encounter: Payer: Self-pay | Admitting: *Deleted

## 2022-08-29 VITALS — BP 133/53 | HR 75

## 2022-08-29 DIAGNOSIS — O99212 Obesity complicating pregnancy, second trimester: Secondary | ICD-10-CM

## 2022-08-29 DIAGNOSIS — Z8742 Personal history of other diseases of the female genital tract: Secondary | ICD-10-CM | POA: Diagnosis present

## 2022-08-29 DIAGNOSIS — Z3A30 30 weeks gestation of pregnancy: Secondary | ICD-10-CM | POA: Insufficient documentation

## 2022-08-29 DIAGNOSIS — O99213 Obesity complicating pregnancy, third trimester: Secondary | ICD-10-CM | POA: Diagnosis not present

## 2022-08-29 DIAGNOSIS — O09293 Supervision of pregnancy with other poor reproductive or obstetric history, third trimester: Secondary | ICD-10-CM | POA: Diagnosis not present

## 2022-08-29 DIAGNOSIS — Z362 Encounter for other antenatal screening follow-up: Secondary | ICD-10-CM | POA: Diagnosis not present

## 2022-08-29 DIAGNOSIS — Z3480 Encounter for supervision of other normal pregnancy, unspecified trimester: Secondary | ICD-10-CM

## 2022-08-29 DIAGNOSIS — O2690 Pregnancy related conditions, unspecified, unspecified trimester: Secondary | ICD-10-CM | POA: Diagnosis not present

## 2022-08-29 DIAGNOSIS — O283 Abnormal ultrasonic finding on antenatal screening of mother: Secondary | ICD-10-CM | POA: Insufficient documentation

## 2022-08-30 ENCOUNTER — Ambulatory Visit (INDEPENDENT_AMBULATORY_CARE_PROVIDER_SITE_OTHER): Payer: Medicaid Other | Admitting: Obstetrics & Gynecology

## 2022-08-30 ENCOUNTER — Other Ambulatory Visit: Payer: Medicaid Other

## 2022-08-30 ENCOUNTER — Other Ambulatory Visit: Payer: Self-pay | Admitting: *Deleted

## 2022-08-30 VITALS — BP 108/64 | HR 67 | Wt 266.0 lb

## 2022-08-30 DIAGNOSIS — Z23 Encounter for immunization: Secondary | ICD-10-CM | POA: Diagnosis not present

## 2022-08-30 DIAGNOSIS — Z3A27 27 weeks gestation of pregnancy: Secondary | ICD-10-CM

## 2022-08-30 DIAGNOSIS — O99213 Obesity complicating pregnancy, third trimester: Secondary | ICD-10-CM

## 2022-08-30 DIAGNOSIS — O99210 Obesity complicating pregnancy, unspecified trimester: Secondary | ICD-10-CM

## 2022-08-30 DIAGNOSIS — Z3689 Encounter for other specified antenatal screening: Secondary | ICD-10-CM

## 2022-08-30 DIAGNOSIS — Z3480 Encounter for supervision of other normal pregnancy, unspecified trimester: Secondary | ICD-10-CM

## 2022-08-30 DIAGNOSIS — Z3A30 30 weeks gestation of pregnancy: Secondary | ICD-10-CM

## 2022-08-30 NOTE — Progress Notes (Signed)
ROB/GTT.  TDAP given in RD, tolerated well. ?

## 2022-08-30 NOTE — Progress Notes (Signed)
   PRENATAL VISIT NOTE  Subjective:  Michele Fox is a 27 y.o. G2P1001 at [redacted]w[redacted]d being seen today for ongoing prenatal care.  She is currently monitored for the following issues for this high-risk pregnancy and has Uterine inversion associated with pregnancy; Supervision of other normal pregnancy, antepartum; Obesity affecting pregnancy; and Abnormal fetal ultrasound (Absent nasal bone, EIF) on their problem list.  Patient reports no complaints.  Contractions: Not present. Vag. Bleeding: None.  Movement: Present. Denies leaking of fluid.   The following portions of the patient's history were reviewed and updated as appropriate: allergies, current medications, past family history, past medical history, past social history, past surgical history and problem list.   Objective:   Vitals:   08/30/22 0933  BP: 108/64  Pulse: 67  Weight: 266 lb (120.7 kg)    Fetal Status: Fetal Heart Rate (bpm): 151 Fundal Height: 30 cm Movement: Present     General:  Alert, oriented and cooperative. Patient is in no acute distress.  Skin: Skin is warm and dry. No rash noted.   Cardiovascular: Normal heart rate noted  Respiratory: Normal respiratory effort, no problems with respiration noted  Abdomen: Soft, gravid, appropriate for gestational age.  Pain/Pressure: Present     Pelvic: Cervical exam deferred        Extremities: Normal range of motion.  Edema: Mild pitting, slight indentation  Mental Status: Normal mood and affect. Normal behavior. Normal judgment and thought content.   Assessment and Plan:  Pregnancy: G2P1001 at [redacted]w[redacted]d 1. Supervision of other normal pregnancy, antepartum  - Tdap vaccine greater than or equal to 7yo IM  2. [redacted] weeks gestation of pregnancy  - Tdap vaccine greater than or equal to 7yo IM  - CBC - HIV antibody (with reflex) - RPR - Glucose Tolerance, 2 Hours w/1 Hour  3. Obesity affecting pregnancy, antepartum, unspecified obesity type Body mass index is 48.65  kg/m.   Preterm labor symptoms and general obstetric precautions including but not limited to vaginal bleeding, contractions, leaking of fluid and fetal movement were reviewed in detail with the patient. Please refer to After Visit Summary for other counseling recommendations.   Return in about 2 weeks (around 09/13/2022).  Future Appointments  Date Time Provider Department Center  09/12/2022  1:15 PM [facility] NST [facility]  09/19/2022  1:15 PM [facility] NST [facility]  09/26/2022  2:30 PM [facility] US2 [facility]    Scheryl Darter, MD

## 2022-09-03 ENCOUNTER — Other Ambulatory Visit (HOSPITAL_BASED_OUTPATIENT_CLINIC_OR_DEPARTMENT_OTHER): Payer: Self-pay | Admitting: Student

## 2022-09-03 DIAGNOSIS — O99013 Anemia complicating pregnancy, third trimester: Secondary | ICD-10-CM

## 2022-09-03 MED ORDER — FERROUS SULFATE 325 (65 FE) MG PO TBEC
325.0000 mg | DELAYED_RELEASE_TABLET | Freq: Every day | ORAL | 3 refills | Status: DC
Start: 2022-09-03 — End: 2023-08-16

## 2022-09-12 ENCOUNTER — Ambulatory Visit: Payer: 59

## 2022-09-13 ENCOUNTER — Ambulatory Visit: Payer: Medicaid Other

## 2022-09-15 ENCOUNTER — Ambulatory Visit: Payer: 59 | Attending: Obstetrics and Gynecology

## 2022-09-19 ENCOUNTER — Other Ambulatory Visit: Payer: Medicaid Other

## 2022-09-20 ENCOUNTER — Encounter: Payer: Medicaid Other | Admitting: Obstetrics and Gynecology

## 2022-09-21 ENCOUNTER — Ambulatory Visit: Payer: Medicaid Other | Attending: Obstetrics and Gynecology | Admitting: *Deleted

## 2022-09-21 DIAGNOSIS — Z3A33 33 weeks gestation of pregnancy: Secondary | ICD-10-CM | POA: Insufficient documentation

## 2022-09-21 DIAGNOSIS — O99213 Obesity complicating pregnancy, third trimester: Secondary | ICD-10-CM | POA: Insufficient documentation

## 2022-09-21 NOTE — Procedures (Signed)
Michele Fox [birth year] [redacted]w[redacted]d  Fetus A Non-Stress Test Interpretation for 09/21/22  NST only  Indication:  Obesity  Fetal Heart Rate A Mode: External Baseline Rate (A): 140 bpm Variability: Moderate Accelerations: 15 x 15 Decelerations: None Multiple birth?: No  Uterine Activity Mode: Palpation, Toco Contraction Frequency (min): none Resting Tone Palpated: Relaxed  Interpretation (Fetal Testing) Nonstress Test Interpretation: Reactive Overall Impression: Reassuring for gestational age Comments: Dr. Parke Poisson reviewed tracing

## 2022-09-26 ENCOUNTER — Other Ambulatory Visit: Payer: Self-pay | Admitting: *Deleted

## 2022-09-26 ENCOUNTER — Other Ambulatory Visit: Payer: Self-pay | Admitting: Maternal & Fetal Medicine

## 2022-09-26 ENCOUNTER — Ambulatory Visit: Payer: Medicaid Other | Attending: Maternal & Fetal Medicine

## 2022-09-26 DIAGNOSIS — O09293 Supervision of pregnancy with other poor reproductive or obstetric history, third trimester: Secondary | ICD-10-CM

## 2022-09-26 DIAGNOSIS — O99213 Obesity complicating pregnancy, third trimester: Secondary | ICD-10-CM

## 2022-09-26 DIAGNOSIS — Z3689 Encounter for other specified antenatal screening: Secondary | ICD-10-CM | POA: Insufficient documentation

## 2022-09-26 DIAGNOSIS — O365930 Maternal care for other known or suspected poor fetal growth, third trimester, not applicable or unspecified: Secondary | ICD-10-CM

## 2022-09-26 DIAGNOSIS — Z3A34 34 weeks gestation of pregnancy: Secondary | ICD-10-CM

## 2022-10-02 ENCOUNTER — Telehealth: Payer: Self-pay | Admitting: Licensed Clinical Social Worker

## 2022-10-02 NOTE — Telephone Encounter (Signed)
Called pt to follow up regarding elevated edinburgh score. Unable to reach pt or leave voicemail. DPR listed does not provide additional contact information

## 2022-10-03 ENCOUNTER — Ambulatory Visit: Payer: Medicaid Other | Attending: Obstetrics

## 2022-10-03 DIAGNOSIS — O365930 Maternal care for other known or suspected poor fetal growth, third trimester, not applicable or unspecified: Secondary | ICD-10-CM | POA: Diagnosis not present

## 2022-10-03 DIAGNOSIS — Z3A35 35 weeks gestation of pregnancy: Secondary | ICD-10-CM | POA: Diagnosis not present

## 2022-10-03 DIAGNOSIS — O09293 Supervision of pregnancy with other poor reproductive or obstetric history, third trimester: Secondary | ICD-10-CM | POA: Diagnosis not present

## 2022-10-03 DIAGNOSIS — O99213 Obesity complicating pregnancy, third trimester: Secondary | ICD-10-CM | POA: Diagnosis not present

## 2022-10-04 ENCOUNTER — Encounter: Payer: Self-pay | Admitting: Obstetrics and Gynecology

## 2022-10-04 ENCOUNTER — Ambulatory Visit (INDEPENDENT_AMBULATORY_CARE_PROVIDER_SITE_OTHER): Payer: Medicaid Other | Admitting: Obstetrics and Gynecology

## 2022-10-04 ENCOUNTER — Other Ambulatory Visit (HOSPITAL_COMMUNITY)
Admission: RE | Admit: 2022-10-04 | Discharge: 2022-10-04 | Disposition: A | Discharge status: Home / Self Care | Payer: Medicaid Other | Source: Ambulatory Visit | Attending: Obstetrics and Gynecology | Admitting: Obstetrics and Gynecology

## 2022-10-04 VITALS — BP 123/67 | HR 77 | Wt 267.4 lb

## 2022-10-04 DIAGNOSIS — Z3480 Encounter for supervision of other normal pregnancy, unspecified trimester: Secondary | ICD-10-CM | POA: Diagnosis not present

## 2022-10-04 DIAGNOSIS — O365930 Maternal care for other known or suspected poor fetal growth, third trimester, not applicable or unspecified: Secondary | ICD-10-CM

## 2022-10-04 DIAGNOSIS — Z3A35 35 weeks gestation of pregnancy: Secondary | ICD-10-CM

## 2022-10-04 NOTE — Addendum Note (Signed)
Addended by: Jearld Adjutant on: 10/04/2022 04:16 PM   Modules accepted: Orders

## 2022-10-04 NOTE — Progress Notes (Signed)
   PRENATAL VISIT NOTE  Subjective:  Michele Fox is a 27 y.o. G2P1001 at [redacted]w[redacted]d being seen today for ongoing prenatal care.  She is currently monitored for the following issues for this low-risk pregnancy and has Uterine inversion associated with pregnancy; Supervision of other normal pregnancy, antepartum; Obesity affecting pregnancy; and Abnormal fetal ultrasound (Absent nasal bone, EIF) on their problem list.  Patient reports  intermittent pelvic pain . Denies leaking of fluid, vaginal bleeding or contractions  The following portions of the patient's history were reviewed and updated as appropriate: allergies, current medications, past family history, past medical history, past social history, past surgical history and problem list.   Objective:   Vitals:   10/04/22 1538  BP: 123/67  Pulse: 77  Weight: 267 lb 6.4 oz (121.3 kg)    Fetal Status: Fetal Heart Rate (bpm): 153 Fundal Height: 35 cm Movement: Present     General:  Alert, oriented and cooperative. Patient is in no acute distress.  Skin: Skin is warm and dry. No rash noted.   Cardiovascular: Normal heart rate noted  Respiratory: Normal respiratory effort, no problems with respiration noted  Abdomen: Soft, gravid, appropriate for gestational age.  Pain/Pressure: Present     Pelvic: Cervical exam deferred        Extremities: Normal range of motion.  Edema: Trace  Mental Status: Normal mood and affect. Normal behavior. Normal judgment and thought content.   Assessment and Plan:  Pregnancy: G2P1001 at [redacted]w[redacted]d 1. Supervision of other normal pregnancy, antepartum BP and FHR normal Feeling regular fetal movement  2. [redacted] weeks gestation of pregnancy Swabs collected today Symptomatic tx pelvic pain, follow up if worsen  3. Poor fetal growth affecting management of mother in third trimester, single or unspecified fetus Reviewed results and testing with her 9/3 u/s EFW 4.3%, AC 4.4%, afi nml 9/10 BPP 8/8, normal  dopplers Weekly testing   Preterm labor symptoms and general obstetric precautions including but not limited to vaginal bleeding, contractions, leaking of fluid and fetal movement were reviewed in detail with the patient. Please refer to After Visit Summary for other counseling recommendations.    Future Appointments  Date Time Provider Department Center  10/10/2022  3:30 PM [facility] US6 [facility]  10/17/2022  3:30 PM [facility]  10/18/2022  3:30 PM Sue Lush, FNP [facility] None    Albertine Grates, FNP

## 2022-10-04 NOTE — Progress Notes (Signed)
Pt presents for ROB visit. Pt requesting [place] results from yesterday. Pt c/o pelvic pain

## 2022-10-06 LAB — CERVICOVAGINAL ANCILLARY ONLY
Chlamydia: NEGATIVE
Comment: NEGATIVE
Comment: NEGATIVE
Comment: NORMAL
Neisseria Gonorrhea: NEGATIVE
Trichomonas: NEGATIVE

## 2022-10-07 LAB — CULTURE, BETA STREP (GROUP B ONLY): Strep Gp B Culture: POSITIVE — AB

## 2022-10-09 ENCOUNTER — Encounter: Payer: Self-pay | Admitting: Obstetrics and Gynecology

## 2022-10-09 DIAGNOSIS — O99820 Streptococcus B carrier state complicating pregnancy: Secondary | ICD-10-CM | POA: Insufficient documentation

## 2022-10-10 ENCOUNTER — Ambulatory Visit: Payer: Medicaid Other | Attending: Obstetrics

## 2022-10-17 ENCOUNTER — Ambulatory Visit: Payer: Medicaid Other | Attending: Obstetrics

## 2022-10-17 DIAGNOSIS — O09293 Supervision of pregnancy with other poor reproductive or obstetric history, third trimester: Secondary | ICD-10-CM

## 2022-10-17 DIAGNOSIS — O365930 Maternal care for other known or suspected poor fetal growth, third trimester, not applicable or unspecified: Secondary | ICD-10-CM

## 2022-10-17 DIAGNOSIS — O99213 Obesity complicating pregnancy, third trimester: Secondary | ICD-10-CM

## 2022-10-17 DIAGNOSIS — Z3A37 37 weeks gestation of pregnancy: Secondary | ICD-10-CM | POA: Diagnosis not present

## 2022-10-18 ENCOUNTER — Other Ambulatory Visit: Payer: Self-pay | Admitting: Advanced Practice Midwife

## 2022-10-18 ENCOUNTER — Ambulatory Visit (INDEPENDENT_AMBULATORY_CARE_PROVIDER_SITE_OTHER): Payer: Self-pay | Admitting: Obstetrics and Gynecology

## 2022-10-18 VITALS — BP 128/79 | HR 75 | Wt 269.8 lb

## 2022-10-18 DIAGNOSIS — O365930 Maternal care for other known or suspected poor fetal growth, third trimester, not applicable or unspecified: Secondary | ICD-10-CM

## 2022-10-18 DIAGNOSIS — Z3A37 37 weeks gestation of pregnancy: Secondary | ICD-10-CM

## 2022-10-18 DIAGNOSIS — Z3480 Encounter for supervision of other normal pregnancy, unspecified trimester: Secondary | ICD-10-CM

## 2022-10-18 NOTE — Progress Notes (Unsigned)
   PRENATAL VISIT NOTE  Subjective:  Michele Fox is a 27 y.o. G2P1001 at [redacted]w[redacted]d being seen today for ongoing prenatal care.  She is currently monitored for the following issues for this {Blank single:19197::"high-risk","low-risk"} pregnancy and has Uterine inversion associated with pregnancy; Supervision of other normal pregnancy, antepartum; Obesity affecting pregnancy; Abnormal fetal ultrasound (Absent nasal bone, EIF); and Group B Streptococcus carrier, +RV culture, currently pregnant on their problem list.  Patient reports {sx:[id number]}.  Contractions: Not present. Vag. Bleeding: None.  Movement: Present. Denies leaking of fluid.   The following portions of the patient's history were reviewed and updated as appropriate: allergies, current medications, past family history, past medical history, past social history, past surgical history and problem list.   Objective:   Vitals:   10/18/22 1518  BP: 128/79  Pulse: 75  Weight: 269 lb 12.8 oz (122.4 kg)    Fetal Status: Fetal Heart Rate (bpm): 148   Movement: Present     General:  Alert, oriented and cooperative. Patient is in no acute distress.  Skin: Skin is warm and dry. No rash noted.   Cardiovascular: Normal heart rate noted  Respiratory: Normal respiratory effort, no problems with respiration noted  Abdomen: Soft, gravid, appropriate for gestational age.  Pain/Pressure: Present     Pelvic: {Blank single:19197::"Cervical exam performed in the presence of a chaperone","Cervical exam deferred"}        Extremities: Normal range of motion.  Edema: Trace  Mental Status: Normal mood and affect. Normal behavior. Normal judgment and thought content.   Assessment and Plan:  Pregnancy: G2P1001 at [redacted]w[redacted]d There are no diagnoses linked to this encounter. {Blank single:19197::"Term","Preterm"} labor symptoms and general obstetric precautions including but not limited to vaginal bleeding, contractions, leaking of fluid and fetal movement were  reviewed in detail with the patient. Please refer to After Visit Summary for other counseling recommendations.   No follow-ups on file.  No future appointments.  Albertine Grates, FNP

## 2022-10-19 ENCOUNTER — Inpatient Hospital Stay (HOSPITAL_COMMUNITY)
Admission: AD | Admit: 2022-10-19 | Discharge: 2022-10-22 | DRG: 805 | Disposition: A | Discharge status: Home / Self Care | Payer: Medicaid Other | Attending: Obstetrics and Gynecology | Admitting: Obstetrics and Gynecology

## 2022-10-19 ENCOUNTER — Encounter (HOSPITAL_COMMUNITY): Payer: Self-pay | Admitting: Obstetrics and Gynecology

## 2022-10-19 ENCOUNTER — Inpatient Hospital Stay (HOSPITAL_COMMUNITY): Payer: Medicaid Other | Admitting: Anesthesiology

## 2022-10-19 ENCOUNTER — Inpatient Hospital Stay (HOSPITAL_COMMUNITY): Payer: Medicaid Other

## 2022-10-19 ENCOUNTER — Other Ambulatory Visit: Payer: Self-pay

## 2022-10-19 DIAGNOSIS — U071 COVID-19: Secondary | ICD-10-CM | POA: Diagnosis present

## 2022-10-19 DIAGNOSIS — Z3490 Encounter for supervision of normal pregnancy, unspecified, unspecified trimester: Secondary | ICD-10-CM

## 2022-10-19 DIAGNOSIS — Z880 Allergy status to penicillin: Secondary | ICD-10-CM | POA: Diagnosis not present

## 2022-10-19 DIAGNOSIS — O365990 Maternal care for other known or suspected poor fetal growth, unspecified trimester, not applicable or unspecified: Secondary | ICD-10-CM | POA: Diagnosis present

## 2022-10-19 DIAGNOSIS — O99824 Streptococcus B carrier state complicating childbirth: Secondary | ICD-10-CM | POA: Diagnosis present

## 2022-10-19 DIAGNOSIS — Z833 Family history of diabetes mellitus: Secondary | ICD-10-CM | POA: Diagnosis not present

## 2022-10-19 DIAGNOSIS — Z8249 Family history of ischemic heart disease and other diseases of the circulatory system: Secondary | ICD-10-CM | POA: Diagnosis not present

## 2022-10-19 DIAGNOSIS — O99820 Streptococcus B carrier state complicating pregnancy: Secondary | ICD-10-CM | POA: Diagnosis not present

## 2022-10-19 DIAGNOSIS — O134 Gestational [pregnancy-induced] hypertension without significant proteinuria, complicating childbirth: Secondary | ICD-10-CM | POA: Diagnosis present

## 2022-10-19 DIAGNOSIS — O99214 Obesity complicating childbirth: Secondary | ICD-10-CM | POA: Diagnosis present

## 2022-10-19 DIAGNOSIS — R059 Cough, unspecified: Secondary | ICD-10-CM | POA: Diagnosis present

## 2022-10-19 DIAGNOSIS — O4590 Premature separation of placenta, unspecified, unspecified trimester: Secondary | ICD-10-CM | POA: Diagnosis not present

## 2022-10-19 DIAGNOSIS — O9852 Other viral diseases complicating childbirth: Secondary | ICD-10-CM | POA: Diagnosis present

## 2022-10-19 DIAGNOSIS — O365930 Maternal care for other known or suspected poor fetal growth, third trimester, not applicable or unspecified: Principal | ICD-10-CM | POA: Diagnosis present

## 2022-10-19 DIAGNOSIS — O139 Gestational [pregnancy-induced] hypertension without significant proteinuria, unspecified trimester: Secondary | ICD-10-CM | POA: Diagnosis not present

## 2022-10-19 DIAGNOSIS — Z3A37 37 weeks gestation of pregnancy: Secondary | ICD-10-CM

## 2022-10-19 DIAGNOSIS — O9081 Anemia of the puerperium: Secondary | ICD-10-CM | POA: Diagnosis not present

## 2022-10-19 DIAGNOSIS — O411290 Chorioamnionitis, unspecified trimester, not applicable or unspecified: Secondary | ICD-10-CM | POA: Diagnosis not present

## 2022-10-19 HISTORY — DX: Encounter for supervision of normal pregnancy, unspecified, unspecified trimester: Z34.90

## 2022-10-19 LAB — TYPE AND SCREEN
ABO/RH(D): A POS
Antibody Screen: NEGATIVE

## 2022-10-19 LAB — COMPREHENSIVE METABOLIC PANEL
ALT: 8 U/L (ref 0–44)
AST: 17 U/L (ref 15–41)
Albumin: 2.6 g/dL — ABNORMAL LOW (ref 3.5–5.0)
Alkaline Phosphatase: 58 U/L (ref 38–126)
Anion gap: 11 (ref 5–15)
BUN: 8 mg/dL (ref 6–20)
CO2: 20 mmol/L — ABNORMAL LOW (ref 22–32)
Calcium: 8.3 mg/dL — ABNORMAL LOW (ref 8.9–10.3)
Chloride: 104 mmol/L (ref 98–111)
Creatinine, Ser: 0.71 mg/dL (ref 0.44–1.00)
GFR, Estimated: >60 mL/min (ref >60)
Glucose, Bld: 88 mg/dL (ref 70–99)
Potassium: 4 mmol/L (ref 3.5–5.1)
Sodium: 135 mmol/L (ref 135–145)
Total Bilirubin: 0.9 mg/dL (ref 0.3–1.2)
Total Protein: 6.5 g/dL (ref 6.5–8.1)

## 2022-10-19 LAB — SARS CORONAVIRUS 2 BY RT PCR: SARS Coronavirus 2 by RT PCR: POSITIVE — AB

## 2022-10-19 LAB — CBC
HCT: 28.9 % — ABNORMAL LOW (ref 36.0–46.0)
Hemoglobin: 9.6 g/dL — ABNORMAL LOW (ref 12.0–15.0)
MCH: 30.5 pg (ref 26.0–34.0)
MCHC: 33.2 g/dL (ref 30.0–36.0)
MCV: 91.7 fL (ref 80.0–100.0)
Platelets: 207 10*3/uL (ref 150–400)
RBC: 3.15 MIL/uL — ABNORMAL LOW (ref 3.87–5.11)
RDW: 13 % (ref 11.5–15.5)
WBC: 5.9 10*3/uL (ref 4.0–10.5)
nRBC: 0 % (ref 0.0–0.2)

## 2022-10-19 LAB — RESPIRATORY PANEL BY PCR

## 2022-10-19 LAB — RPR: RPR Ser Ql: NONREACTIVE

## 2022-10-19 MED ORDER — FLEET ENEMA RE ENEM: 1.0000 | ENEMA | RECTAL | Status: DC | PRN

## 2022-10-19 MED ORDER — EPHEDRINE 5 MG/ML INJ: 10.0000 mg | INTRAVENOUS | Status: DC | PRN

## 2022-10-19 MED ORDER — PHENYLEPHRINE 80 MCG/ML (10ML) SYRINGE FOR IV PUSH (FOR BLOOD PRESSURE SUPPORT): 80.0000 ug | PREFILLED_SYRINGE | INTRAVENOUS | Status: DC | PRN

## 2022-10-19 MED ORDER — VANCOMYCIN HCL 10 G IV SOLR
2000.0000 mg | Freq: Three times a day (TID) | INTRAVENOUS | Status: DC
  Administered 2022-10-19: 2000 mg via INTRAVENOUS
  Filled 2022-10-19: qty 2000
  Filled 2022-10-19 (×2): qty 20

## 2022-10-19 MED ORDER — CEFAZOLIN SODIUM-DEXTROSE 2-4 GM/100ML-% IV SOLN
2.0000 g | Freq: Once | INTRAVENOUS | Status: AC
  Administered 2022-10-19: 2 g via INTRAVENOUS
  Filled 2022-10-19: qty 100

## 2022-10-19 MED ORDER — LIDOCAINE HCL (PF) 1 % IJ SOLN: 30.0000 mL | INTRAMUSCULAR | Status: DC | PRN

## 2022-10-19 MED ORDER — LACTATED RINGERS IV SOLN: 500.0000 mL | INTRAVENOUS | Status: DC | PRN

## 2022-10-19 MED ORDER — LACTATED RINGERS IV SOLN
500.0000 mL | Freq: Once | INTRAVENOUS | Status: AC
  Administered 2022-10-19: 500 mL via INTRAVENOUS

## 2022-10-19 MED ORDER — FENTANYL-BUPIVACAINE-NACL 0.5-0.125-0.9 MG/250ML-% EP SOLN
EPIDURAL | Status: DC | PRN
  Administered 2022-10-19: 12 mL/h via EPIDURAL

## 2022-10-19 MED ORDER — TERBUTALINE SULFATE 1 MG/ML IJ SOLN: 0.2500 mg | Freq: Once | INTRAMUSCULAR | Status: DC | PRN

## 2022-10-19 MED ORDER — SOD CITRATE-CITRIC ACID 500-334 MG/5ML PO SOLN: 30.0000 mL | ORAL | Status: DC | PRN

## 2022-10-19 MED ORDER — DIPHENHYDRAMINE HCL 50 MG/ML IJ SOLN: 12.5000 mg | INTRAMUSCULAR | Status: DC | PRN

## 2022-10-19 MED ORDER — OXYCODONE-ACETAMINOPHEN 5-325 MG PO TABS: 2.0000 | ORAL_TABLET | ORAL | Status: DC | PRN

## 2022-10-19 MED ORDER — OXYTOCIN BOLUS FROM INFUSION
333.0000 mL | Freq: Once | INTRAVENOUS | Status: AC
  Administered 2022-10-20: 333 mL via INTRAVENOUS

## 2022-10-19 MED ORDER — CEFAZOLIN SODIUM-DEXTROSE 1-4 GM/50ML-% IV SOLN
1.0000 g | Freq: Three times a day (TID) | INTRAVENOUS | Status: DC
  Administered 2022-10-19: 1 g via INTRAVENOUS
  Filled 2022-10-19 (×4): qty 50

## 2022-10-19 MED ORDER — LACTATED RINGERS IV SOLN: INTRAVENOUS | Status: DC

## 2022-10-19 MED ORDER — MISOPROSTOL 50MCG HALF TABLET
50.0000 ug | ORAL_TABLET | ORAL | Status: DC
  Administered 2022-10-19 (×3): 50 ug via BUCCAL
  Filled 2022-10-19 (×3): qty 1

## 2022-10-19 MED ORDER — OXYCODONE-ACETAMINOPHEN 5-325 MG PO TABS: 1.0000 | ORAL_TABLET | ORAL | Status: DC | PRN

## 2022-10-19 MED ORDER — LIDOCAINE HCL (PF) 1 % IJ SOLN
INTRAMUSCULAR | Status: DC | PRN
  Administered 2022-10-19: 5 mL via EPIDURAL

## 2022-10-19 MED ORDER — ONDANSETRON HCL 4 MG/2ML IJ SOLN
4.0000 mg | Freq: Four times a day (QID) | INTRAMUSCULAR | Status: DC | PRN
  Administered 2022-10-19: 4 mg via INTRAVENOUS
  Filled 2022-10-19: qty 2

## 2022-10-19 MED ORDER — GUAIFENESIN 100 MG/5ML PO LIQD
5.0000 mL | ORAL | Status: DC | PRN
  Administered 2022-10-19: 5 mL via ORAL
  Filled 2022-10-19 (×2): qty 5

## 2022-10-19 MED ORDER — OXYTOCIN-SODIUM CHLORIDE 30-0.9 UT/500ML-% IV SOLN
2.5000 [IU]/h | INTRAVENOUS | Status: DC
  Administered 2022-10-20: 2.5 [IU]/h via INTRAVENOUS
  Filled 2022-10-19: qty 500

## 2022-10-19 MED ORDER — OXYTOCIN-SODIUM CHLORIDE 30-0.9 UT/500ML-% IV SOLN
1.0000 m[IU]/min | INTRAVENOUS | Status: DC
  Administered 2022-10-19: 2 m[IU]/min via INTRAVENOUS
  Administered 2022-10-19: 6 m[IU]/min via INTRAVENOUS

## 2022-10-19 MED ORDER — FENTANYL-BUPIVACAINE-NACL 0.5-0.125-0.9 MG/250ML-% EP SOLN
12.0000 mL/h | EPIDURAL | Status: DC | PRN
  Filled 2022-10-19: qty 250

## 2022-10-19 MED ORDER — ACETAMINOPHEN 325 MG PO TABS
650.0000 mg | ORAL_TABLET | ORAL | Status: DC | PRN
  Administered 2022-10-19: 650 mg via ORAL
  Filled 2022-10-19: qty 2

## 2022-10-19 NOTE — Progress Notes (Signed)
Labor Progress Note Michele Fox is a 27 y.o. G2P1001 at [redacted]w[redacted]d presented for IOL 2/2 FGR.  S: Pt reports feeling well, no concerns or questions. Verbally consented to AROM.   O:  BP (!) 132/55   Pulse 63   Temp 98.6 F (37 C) (Oral)   LMP 01/29/2022 (Exact Date)  EFM: 165/mod/+a/-d  CVE: Dilation: 4 Effacement (%): 50 Cervical Position: Posterior Station: -3 Presentation: Vertex Exam by:: Lucianne Muss, MD   A&P: 27 y.o. G2P1001 [redacted]w[redacted]d here for IOL 2/2 FGR. #Labor: Progressing well. AROM performed, moderate amount of clear fluid. Plan to get better EFM, then will start Pit 2x2 #Pain: Planning on epidural #FWB: Cat II strip --> if ongoing fetal tachycardia, plan for bolus #GBS positive, cont Ancef  #COVID positive status: mainly having URI sxs, will add Robitussin for symptomatic relief  Sundra Aland, MD 7:38 PM

## 2022-10-19 NOTE — Progress Notes (Signed)
Labor Progress Note Michele Fox is a 27 y.o. G2P1001 at [redacted]w[redacted]d presented for IOL 2/2 FGR.  S: Pt tearful about COVID diagnosis, limitations to visitors. Otherwise, she is coping w ctx well.  O:  BP (!) 147/90   Pulse 66   Temp 98.4 F (36.9 C) (Oral)   LMP 01/29/2022 (Exact Date)  EFM: 155/mod/+a/-d  CVE: Dilation: 2 Effacement (%): 40 Cervical Position: Posterior Station: -3 Presentation: Vertex Exam by:: Dr. Lucianne Muss   A&P: 27 y.o. G2P1001 [redacted]w[redacted]d here for IOL 2/2 FGR. #Labor: Progressing well. Pt verbally consented to FB, which was placed with 40cc. Will give additional dose of Cytotec ( buccal) at this time. #Pain: Per pt request #FWB: Cat I #GBS positive, cont Ancef  #COVID positive status: mainly having URI sxs, will add Robitussin for symptomatic relief  Sundra Aland, MD 2:49 PM

## 2022-10-19 NOTE — H&P (Addendum)
OBSTETRIC ADMISSION HISTORY AND PHYSICAL  Michele Fox is a 27 y.o. female G2P1001 with IUP at [redacted]w[redacted]d by L=8 presenting for IOL due to FGR. She reports +FMs, No LOF, no VB, no blurry vision, headaches or peripheral edema, and RUQ pain.  She plans on breastfeeding. She is undecided for birth control.  She received her prenatal care at  [facility]    Dating: By LMP --->  Estimated Date of Delivery: 11/05/22  Sono:    10/17/22: normal anatomy except absent nasal bone and EICF, cephalic presentation, 2345g, 8.4% EFW, AC 3.5%, normal AFI. Normal dopplers.    Prenatal History/Complications:  GBS pos Absent nasal bone and EICF - LR NIPS, declined amnio H/o uterine inversion with last delivery Obesity - BMI >40 prepregnancy  Past Medical History: Past Medical History:  Diagnosis Date   Group beta Strep positive 08/12/2018   Incomplete uterine prolapse 08/12/2018   With delivery   Postpartum hemorrhage 08/12/2018   Uterine inversion associated with pregnancy 08/16/2018    Past Surgical History: Past Surgical History:  Procedure Laterality Date   UTERINE SEPTUM RESECTION N/A 08/17/2018   Procedure: REVERSAL OF UTERINE INVERSION WITH PLACEMENT BAKRI BALLOON;  Surgeon: Tilda Burrow, MD;  Location: [facility] OR;  Service: Gynecology;  Laterality: N/A;    Obstetrical History: OB History     Gravida  2   Para  1   Term  1   Preterm      AB      Living  1      SAB      IAB      Ectopic      Multiple  0   Live Births  1           Social History Social History   Socioeconomic History   Marital status: Single    Spouse name: Not on file   Number of children: Not on file   Years of education: Not on file   Highest education level: Not on file  Occupational History   Not on file  Tobacco Use   Smoking status: Never   Smokeless tobacco: Never  Vaping Use   Vaping status: Never Used  Substance and Sexual Activity   Alcohol use: Not Currently    Comment: occ,  prior to pregnancy   Drug use: Never   Sexual activity: Yes    Partners: Male    Birth control/protection: None  Other Topics Concern   Not on file  Social History Narrative   Not on file   Social Determinants of Health   Financial Resource Strain: Not on file  Food Insecurity: No Food Insecurity (10/19/2022)   Hunger Vital Sign    Worried About Running Out of Food in the Last Year: Never true    Ran Out of Food in the Last Year: Never true  Transportation Needs: No Transportation Needs (10/19/2022)   PRAPARE - Administrator, Civil Service (Medical): No    Lack of Transportation (Non-Medical): No  Physical Activity: Not on file  Stress: Not on file  Social Connections: Not on file    Family History: Family History  Problem Relation Age of Onset   Hypertension Mother    Gout Mother    Diabetes Father    Diabetes Paternal Grandmother     Allergies: Allergies  Allergen Reactions   Penicillins Rash    Medications Prior to Admission  Medication Sig Dispense Refill Last Dose   cetirizine (ZYRTEC ALLERGY) 10 MG  tablet Take 1 tablet (10 mg total) by mouth daily. (Patient taking differently: Take 10 mg by mouth daily as needed for allergies.) 30 tablet 11 Past Month   ferrous sulfate 325 (65 FE) MG EC tablet Take 1 tablet (325 mg total) by mouth daily with breakfast. 45 tablet 3 Past Week   Prenatal Vit-Fe Fumarate-FA (PRENATAL PO) Take 1 tablet by mouth daily.   10/18/2022     Review of Systems   All systems reviewed and negative except as stated in HPI  Blood pressure (!) 147/90, pulse 66, temperature 98.4 F (36.9 C), temperature source Oral, last menstrual period 01/29/2022. General appearance: alert, cooperative, appears stated age, and no distress Lungs: clear to auscultation bilaterally Heart: regular rate and rhythm Abdomen: soft, non-tender; bowel sounds normal Extremities: Homans sign is negative, no sign of DVT Presentation: cephalic Fetal  monitoring: Baseline: 130 bpm, Variability: Good {> 6 bpm), Accelerations: Reactive, and Decelerations: Absent Uterine activity: None Dilation: 1.5 Effacement (%): 50 Station: -3 Exam by:: Josph Macho RN   Prenatal labs: ABO, Rh: --/--/A POS (09/26 0514) Antibody: NEG (09/26 0514) Rubella: 5.92 (03/12 1531) RPR: NON REACTIVE (09/26 0512)  HBsAg: Negative (03/12 1531)  HIV: Non Reactive (08/07 0934)  GBS: Positive/-- (09/11 1627)  2 hr Glucola  72/73/69 Genetic screening  LR NIPS - female Anatomy US - normal except EICF, absent nasal bone  Prenatal Transfer Tool  Maternal Diabetes: No Genetic Screening: Normal Maternal Ultrasounds/Referrals: IUGR, Isolated EIF (echogenic intracardiac focus), and Absent nasal bone Fetal Ultrasounds or other Referrals:  Referred to Materal Fetal Medicine  Maternal Substance Abuse:  No Significant Maternal Medications:  None Significant Maternal Lab Results:  Group B Strep positive Number of Prenatal Visits:greater than 3 verified prenatal visits Other Comments:  None  Results for orders placed or performed during the hospital encounter of 10/19/22 (from the past 24 hour(s))  CBC   Collection Time: 10/19/22  5:12 AM  Result Value Ref Range   WBC 5.9 4.0 - 10.5 K/uL   RBC 3.15 (L) 3.87 - 5.11 MIL/uL   Hemoglobin 9.6 (L) 12.0 - 15.0 g/dL   HCT 16.1 (L) 09.6 - 04.5 %   MCV 91.7 80.0 - 100.0 fL   MCH 30.5 26.0 - 34.0 pg   MCHC 33.2 30.0 - 36.0 g/dL   RDW 40.9 81.1 - 91.4 %   Platelets 207 150 - 400 K/uL   nRBC 0.0 0.0 - 0.2 %  RPR   Collection Time: 10/19/22  5:12 AM  Result Value Ref Range   RPR Ser Ql NON REACTIVE NON REACTIVE  Comprehensive metabolic panel   Collection Time: 10/19/22  5:12 AM  Result Value Ref Range   Sodium 135 135 - 145 mmol/L   Potassium 4.0 3.5 - 5.1 mmol/L   Chloride 104 98 - 111 mmol/L   CO2 20 (L) 22 - 32 mmol/L   Glucose, Bld 88 70 - 99 mg/dL   BUN 8 6 - 20 mg/dL   Creatinine, Ser 7.82 0.44 - 1.00  mg/dL   Calcium 8.3 (L) 8.9 - 10.3 mg/dL   Total Protein 6.5 6.5 - 8.1 g/dL   Albumin 2.6 (L) 3.5 - 5.0 g/dL   AST 17 15 - 41 U/L   ALT 8 0 - 44 U/L   Alkaline Phosphatase 58 38 - 126 U/L   Total Bilirubin 0.9 0.3 - 1.2 mg/dL   GFR, Estimated >95 >62 mL/min   Anion gap 11 5 - 15  Type and  screen [facility]   Collection Time: 10/19/22  5:14 AM  Result Value Ref Range   ABO/RH(D) A POS    Antibody Screen NEG    Sample Expiration      10/22/2022,2359 Performed at [facility], [address], [place], [place] [address]   Respiratory (~20 pathogens) panel by PCR   Collection Time: 10/19/22 11:03 AM   Specimen: Nasopharyngeal Swab; Respiratory  Result Value Ref Range   Adenovirus NOT DETECTED NOT DETECTED   Coronavirus 229E NOT DETECTED NOT DETECTED   Coronavirus HKU1 NOT DETECTED NOT DETECTED   Coronavirus NL63 NOT DETECTED NOT DETECTED   Coronavirus OC43 NOT DETECTED NOT DETECTED   Metapneumovirus NOT DETECTED NOT DETECTED   Rhinovirus / Enterovirus NOT DETECTED NOT DETECTED   Influenza A NOT DETECTED NOT DETECTED   Influenza B NOT DETECTED NOT DETECTED   Parainfluenza Virus 1 NOT DETECTED NOT DETECTED   Parainfluenza Virus 2 NOT DETECTED NOT DETECTED   Parainfluenza Virus 3 NOT DETECTED NOT DETECTED   Parainfluenza Virus 4 NOT DETECTED NOT DETECTED   Respiratory Syncytial Virus NOT DETECTED NOT DETECTED   Bordetella pertussis NOT DETECTED NOT DETECTED   Bordetella Parapertussis NOT DETECTED NOT DETECTED   Chlamydophila pneumoniae NOT DETECTED NOT DETECTED   Mycoplasma pneumoniae NOT DETECTED NOT DETECTED  SARS Coronavirus 2 by RT PCR (hospital order, performed in [facility] hospital lab) *cepheid single result test* Anterior Nasal Swab   Collection Time: 10/19/22 11:03 AM   Specimen: Anterior Nasal Swab  Result Value Ref Range   SARS Coronavirus 2 by RT PCR POSITIVE (A) NEGATIVE    Patient Active Problem List   Diagnosis Date Noted   Pregnancy  10/19/2022   Fetal growth restriction antepartum 10/19/2022   Group B Streptococcus carrier, +RV culture, currently pregnant 10/09/2022   Abnormal fetal ultrasound (Absent nasal bone, EIF) 08/29/2022   Obesity affecting pregnancy 06/12/2022   Supervision of other normal pregnancy, antepartum 04/04/2022   Uterine inversion associated with pregnancy 08/16/2018    Assessment/Plan:  Sherelyn Borgelt is a 27 y.o. G2P1001 at [redacted]w[redacted]d here for IOL for FGR  #Labor: Cytotec x 2 doses given, will reassess around 2pm to evaluate cervical change.  #Pain: Epidural when ready. Fentanyl until that time #FWB: Category 1/reactive #ID:  Ancef - PCN allergy is rash. Has had one dose of vancomycin.  #MOF:  Breast #MOC: Undecided #Circ:  NA/female  Sundra Aland, MD  10/19/2022, 2:21 PM

## 2022-10-19 NOTE — Anesthesia Procedure Notes (Signed)
Epidural Patient location during procedure: OB Start time: 10/19/2022 9:01 PM End time: 10/19/2022 9:16 PM  Staffing Anesthesiologist: Trevor Iha, MD Performed: anesthesiologist   Preanesthetic Checklist Completed: patient identified, IV checked, site marked, risks and benefits discussed, surgical consent, monitors and equipment checked, pre-op evaluation and timeout performed  Epidural Patient position: sitting Prep: DuraPrep and site prepped and draped Patient monitoring: continuous pulse ox and blood pressure Approach: midline Injection technique: LOR air  Needle:  Needle type: Tuohy  Needle gauge: 17 G Needle length: 9 cm and 9 Needle insertion depth: 9 cm Catheter type: closed end flexible Catheter size: 19 Gauge Catheter at skin depth: 10 and 16 cm Test dose: negative  Assessment Events: blood not aspirated, no cerebrospinal fluid, injection not painful, no injection resistance, no paresthesia and negative IV test  Additional Notes Patient identified. Risks/Benefits/Options discussed with patient including but not limited to bleeding, infection, nerve damage, paralysis, failed block, incomplete pain control, headache, blood pressure changes, nausea, vomiting, reactions to medication both or allergic, itching and postpartum back pain. Confirmed with bedside nurse the patient's most recent platelet count. Confirmed with patient that they are not currently taking any anticoagulation, have any bleeding history or any family history of bleeding disorders. Patient expressed understanding and wished to proceed. All questions were answered. Sterile technique was used throughout the entire procedure. Please see nursing notes for vital signs. Test dose was given through epidural needle and negative prior to continuing to dose epidural or start infusion. Warning signs of high block given to the patient including shortness of breath, tingling/numbness in hands, complete motor block,  or any concerning symptoms with instructions to call for help. Patient was given instructions on fall risk and not to get out of bed. All questions and concerns addressed with instructions to call with any issues.  1 Attempt (S) . Patient tolerated procedure well.

## 2022-10-19 NOTE — Progress Notes (Signed)
Labor Progress Note Michele Fox is a 27 y.o. G2P1001 at [redacted]w[redacted]d presented for IOL for FGR.  S: Michele Fox is tolerating IOL well so far. Partner at bedside for support. She is congested and frequently coughing.  O:  BP (!) 147/90   Pulse 66   Temp 98.4 F (36.9 C) (Oral)   LMP 01/29/2022 (Exact Date)  EFM: baseline 140 bpm/moderate variability/accels present, decels absent  CVE: Dilation: 1.5 Effacement (%): 50 Cervical Position: Posterior Station: -3 Presentation: Vertex Exam by:: Josph Macho RN   A&P: 27 y.o. G2P1001 [redacted]w[redacted]d here for IOL for FGR. #Labor: s/p Cytotec x 1. Will give second dose of BU Cytotec. Can consider AROM at next cervical check if possible. #Pain: Per patient request (Percocet or epidural) #FWB: Category I tracing #GBS positive -> vancomycin  #COVID: Tested patient due to cough/congestion -> COVID positive. Airborne and contact precautions in place.  Whitman Hero, Medical Student 1:33 PM

## 2022-10-19 NOTE — Anesthesia Preprocedure Evaluation (Signed)
Anesthesia Evaluation  Patient identified by MRN, date of birth, ID band Patient confused    Reviewed: Allergy & Precautions, NPO status , Patient's Chart, lab work & pertinent test results  Airway Mallampati: III  TM Distance: >3 FB Neck ROM: Full    Dental no notable dental hx. (+) Teeth Intact, Dental Advisory Given   Pulmonary neg pulmonary ROS   Pulmonary exam normal breath sounds clear to auscultation       Cardiovascular negative cardio ROS Normal cardiovascular exam Rhythm:Regular Rate:Normal     Neuro/Psych negative neurological ROS  negative psych ROS   GI/Hepatic negative GI ROS, Neg liver ROS,,,  Endo/Other    Renal/GU negative Renal ROS     Musculoskeletal   Abdominal  (+) + obese (BMI 49.4)  Bowel sounds: increased.  Peds  Hematology  (+) Blood dyscrasia, anemia Lab Results      Component                Value               Date                      WBC                      5.9                 10/19/2022                HGB                      9.6 (L)             10/19/2022                HCT                      28.9 (L)            10/19/2022                MCV                      91.7                10/19/2022                PLT                      207                 10/19/2022          \    Anesthesia Other Findings All: PCN  Reproductive/Obstetrics (+) Pregnancy                              Anesthesia Physical Anesthesia Plan  ASA: 3  Anesthesia Plan: Epidural   Post-op Pain Management:    Induction:   PONV Risk Score and Plan:   Airway Management Planned:   Additional Equipment:   Intra-op Plan:   Post-operative Plan:   Informed Consent: I have reviewed the patients History and Physical, chart, labs and discussed the procedure including the risks, benefits and alternatives for the proposed anesthesia with the patient or authorized representative  who has indicated his/her understanding and acceptance.  Plan Discussed with:   Anesthesia Plan Comments: (37.4 wk G2P1 w Anemia  & BMI of 49.4 for LEA)         Anesthesia Quick Evaluation

## 2022-10-20 ENCOUNTER — Encounter (HOSPITAL_COMMUNITY): Payer: Self-pay | Admitting: Obstetrics and Gynecology

## 2022-10-20 DIAGNOSIS — O99214 Obesity complicating childbirth: Secondary | ICD-10-CM

## 2022-10-20 DIAGNOSIS — O365930 Maternal care for other known or suspected poor fetal growth, third trimester, not applicable or unspecified: Secondary | ICD-10-CM

## 2022-10-20 DIAGNOSIS — O411290 Chorioamnionitis, unspecified trimester, not applicable or unspecified: Secondary | ICD-10-CM | POA: Diagnosis not present

## 2022-10-20 DIAGNOSIS — O99820 Streptococcus B carrier state complicating pregnancy: Secondary | ICD-10-CM

## 2022-10-20 DIAGNOSIS — O4590 Premature separation of placenta, unspecified, unspecified trimester: Secondary | ICD-10-CM | POA: Diagnosis not present

## 2022-10-20 DIAGNOSIS — Z3A37 37 weeks gestation of pregnancy: Secondary | ICD-10-CM

## 2022-10-20 DIAGNOSIS — O134 Gestational [pregnancy-induced] hypertension without significant proteinuria, complicating childbirth: Secondary | ICD-10-CM

## 2022-10-20 DIAGNOSIS — O139 Gestational [pregnancy-induced] hypertension without significant proteinuria, unspecified trimester: Secondary | ICD-10-CM | POA: Diagnosis not present

## 2022-10-20 LAB — PROTEIN / CREATININE RATIO, URINE
Creatinine, Urine: 74 mg/dL
Protein Creatinine Ratio: 0.09 mg/mg{creat} (ref 0.00–0.15)
Total Protein, Urine: 7 mg/dL

## 2022-10-20 MED ORDER — ONDANSETRON HCL 4 MG/2ML IJ SOLN: 4.0000 mg | INTRAMUSCULAR | Status: DC | PRN

## 2022-10-20 MED ORDER — ONDANSETRON HCL 4 MG PO TABS: 4.0000 mg | ORAL_TABLET | ORAL | Status: DC | PRN

## 2022-10-20 MED ORDER — SENNOSIDES-DOCUSATE SODIUM 8.6-50 MG PO TABS
2.0000 | ORAL_TABLET | Freq: Every day | ORAL | Status: DC
  Administered 2022-10-21 – 2022-10-22 (×2): 2 via ORAL
  Filled 2022-10-20 (×2): qty 2

## 2022-10-20 MED ORDER — WITCH HAZEL-GLYCERIN EX PADS: 1.0000 | MEDICATED_PAD | CUTANEOUS | Status: DC | PRN

## 2022-10-20 MED ORDER — TRANEXAMIC ACID-NACL 1000-0.7 MG/100ML-% IV SOLN
1000.0000 mg | INTRAVENOUS | Status: AC
  Administered 2022-10-20: 1000 mg via INTRAVENOUS

## 2022-10-20 MED ORDER — GUAIFENESIN 100 MG/5ML PO LIQD
5.0000 mL | ORAL | Status: DC | PRN
  Administered 2022-10-20 – 2022-10-21 (×2): 5 mL via ORAL
  Filled 2022-10-20 (×3): qty 5

## 2022-10-20 MED ORDER — DIPHENHYDRAMINE HCL 25 MG PO CAPS: 25.0000 mg | ORAL_CAPSULE | Freq: Four times a day (QID) | ORAL | Status: DC | PRN

## 2022-10-20 MED ORDER — DIBUCAINE (PERIANAL) 1 % EX OINT: 1.0000 | TOPICAL_OINTMENT | CUTANEOUS | Status: DC | PRN

## 2022-10-20 MED ORDER — FUROSEMIDE 20 MG PO TABS
20.0000 mg | ORAL_TABLET | Freq: Every day | ORAL | Status: DC
  Administered 2022-10-20 – 2022-10-22 (×3): 20 mg via ORAL
  Filled 2022-10-20 (×3): qty 1

## 2022-10-20 MED ORDER — ACETAMINOPHEN 325 MG PO TABS
650.0000 mg | ORAL_TABLET | ORAL | Status: DC | PRN
  Administered 2022-10-20 – 2022-10-22 (×5): 650 mg via ORAL
  Filled 2022-10-20 (×6): qty 2

## 2022-10-20 MED ORDER — MISOPROSTOL 200 MCG PO TABS
800.0000 ug | ORAL_TABLET | Freq: Once | ORAL | Status: AC
  Administered 2022-10-20: 800 ug via RECTAL

## 2022-10-20 MED ORDER — OXYCODONE HCL 5 MG PO TABS
10.0000 mg | ORAL_TABLET | ORAL | Status: DC | PRN
  Administered 2022-10-22: 10 mg via ORAL
  Filled 2022-10-20: qty 2

## 2022-10-20 MED ORDER — OXYCODONE HCL 5 MG PO TABS
5.0000 mg | ORAL_TABLET | ORAL | Status: DC | PRN
  Administered 2022-10-20 – 2022-10-22 (×3): 5 mg via ORAL
  Filled 2022-10-20 (×3): qty 1

## 2022-10-20 MED ORDER — PRENATAL MULTIVITAMIN CH
1.0000 | ORAL_TABLET | Freq: Every day | ORAL | Status: DC
  Administered 2022-10-20 – 2022-10-22 (×3): 1 via ORAL
  Filled 2022-10-20 (×3): qty 1

## 2022-10-20 MED ORDER — TRANEXAMIC ACID-NACL 1000-0.7 MG/100ML-% IV SOLN
INTRAVENOUS | Status: AC
  Filled 2022-10-20: qty 100

## 2022-10-20 MED ORDER — MISOPROSTOL 200 MCG PO TABS
ORAL_TABLET | ORAL | Status: AC
  Filled 2022-10-20: qty 4

## 2022-10-20 MED ORDER — COCONUT OIL OIL: 1.0000 | TOPICAL_OIL | Status: DC | PRN

## 2022-10-20 MED ORDER — ZOLPIDEM TARTRATE 5 MG PO TABS: 5.0000 mg | ORAL_TABLET | Freq: Every evening | ORAL | Status: DC | PRN

## 2022-10-20 MED ORDER — IBUPROFEN 600 MG PO TABS
600.0000 mg | ORAL_TABLET | Freq: Four times a day (QID) | ORAL | Status: DC
  Administered 2022-10-20 – 2022-10-22 (×9): 600 mg via ORAL
  Filled 2022-10-20 (×10): qty 1

## 2022-10-20 MED ORDER — SIMETHICONE 80 MG PO CHEW: 80.0000 mg | CHEWABLE_TABLET | ORAL | Status: DC | PRN

## 2022-10-20 MED ORDER — BENZOCAINE-MENTHOL 20-0.5 % EX AERO: 1.0000 | INHALATION_SPRAY | CUTANEOUS | Status: DC | PRN

## 2022-10-20 NOTE — Progress Notes (Signed)
Patient Vitals for the past 4 hrs:  BP Pulse Resp SpO2  10/19/22 2335 120/65 (!) 59 17 --  10/19/22 2305 (!) 140/74 (!) 56 16 --  10/19/22 2235 135/69 63 18 --  10/19/22 2205 116/62 64 16 --  10/19/22 2137 118/77 (!) 107 17 --  10/19/22 2132 134/62 (!) 58 16 --  10/19/22 2127 134/63 63 16 --  10/19/22 2122 (!) 142/63 61 16 --  10/19/22 2121 -- -- -- 100 %  10/19/22 2116 (!) 141/55 (!) 129 16 --  10/19/22 2115 (!) 147/74 67 16 --   Comfortable w/epidural.  FHR Cat 1. Ctx difficult to trace.  Pitocin at 8/mu/min.  Meets criteria for GHTN. IUPC placed.  Cx 5-6/80/-2/

## 2022-10-20 NOTE — Lactation Note (Signed)
This note was copied from a baby's chart. Lactation Consultation Note  Patient Name: Michele Fox [id number] Date: 10/20/2022 Age:25 hours Reason for consult: Initial assessment;Early term 37-38.6wks;Infant < 6lbs;Breastfeeding assistance  The birth parent is a P2 parent with a 5 hour old infant.  Per the birth parent the infant is not latching and her nipples are hard.  LC showed the birth parent how to use reverse pressure to soften her breasts. She stated that her plan is to mostly pump and bottle feed.  LC set up the DEBP, reviewed washing, drying, and milk storage guidelines.  The birth parent was fitted with a size #24 flange.  LC reviewed LPTI feeding guidelines and the crib card with the parents.  LC also educated the birth parent on pumping frequency and outpatient services.  The birth parent does not have a pump at home and a stork pump form was sent on her behalf. The birth parent had no questions or concerns.   Infant Feeding Plan:  Breastfeed 8+ times in 24 hours according to feeding cues.  Pump after feedings and feed expressed milk to the infant via a bottle.  Supplement according to supplementation guidelines.  Keep feedings to less than 30 min total (including breastfeeding). Call RN/LC for assistance with breastfeeding.   Maternal Data Has patient been taught Hand Expression?: Yes Does the patient have breastfeeding experience prior to this delivery?: No  Feeding Mother's Current Feeding Choice: Breast Milk and Formula  LATCH Score Latch: Too sleepy or reluctant, no latch achieved, no sucking elicited.     Type of Nipple: Flat            Lactation Tools Discussed/Used Tools: Pump;Flanges Flange Size: 24 Breast pump type: Double-Electric Breast Pump Pump Education: Setup, frequency, and cleaning;Milk Storage Reason for Pumping: Infant less than 2500g Pumping frequency: q3hrs  Interventions Interventions: DEBP;Education;Reverse  pressure   Consult Status Consult Status: Follow-up Date: 10/21/22 Follow-up type: In-patient    Delene Loll 10/20/2022, 9:11 AM

## 2022-10-20 NOTE — Discharge Summary (Signed)
Postpartum Discharge Summary  Date of Service updated***     Patient Name: Michele Fox DOB: 1995/11/01 MRN: [id number]  Date of admission: 10/19/2022 Delivery date:10/20/2022 Delivering provider:   Date of discharge: 10/20/2022  Admitting diagnosis: Pregnancy [Z34.90] Intrauterine pregnancy: [redacted]w[redacted]d     Secondary diagnosis:  Principal Problem:   Pregnancy Active Problems:   Fetal growth restriction antepartum   Gestational hypertension  Additional problems: ***    Discharge diagnosis: {DX.:23714}                                              Post partum procedures:{Postpartum procedures:23558} Augmentation: AROM, Cytotec, and IP Foley Complications: None  Hospital course: Induction of Labor With Vaginal Delivery   27 y.o. yo G2P1001 at [redacted]w[redacted]d was admitted to the hospital 10/19/2022 for induction of labor.  Indication for induction:  FGR .  Patient had an labor course that was uncomplicated. Membrane Rupture Time/Date: 7:33 PM,10/19/2022  Delivery Method:Vaginal, Spontaneous Operative Delivery:N/A Episiotomy:   Lacerations:    Details of delivery can be found in separate delivery note.  Patient had a postpartum course complicated by***. Patient is discharged home 10/20/22.  Newborn Data: Birth date:10/20/2022 Birth time:3:41 AM Gender:Female Living status:Living Apgars: ,  Weight:   Magnesium Sulfate received: No BMZ received: No Rhophylac:N/A MMR:N/A T-DaP:Given prenatally Flu: {WUJ:81191} RSV Vaccine received: {RSV:31013} Transfusion:{Transfusion received:30440034}  Immunizations received: Immunization History  Administered Date(s) Administered   Tdap 08/30/2022    Physical exam  Vitals:   10/20/22 0035 10/20/22 0105 10/20/22 0135 10/20/22 0205  BP: (!) 130/54 (!) 145/64 (!) 144/66 (!) 154/87  Pulse: 60 62 63 65  Resp:   16 16  Temp:      TempSrc:      SpO2:       General: {Exam; general:21111117} Lochia: {Desc;  appropriate/inappropriate:30686::"appropriate"} Uterine Fundus: {Desc; firm/soft:30687} Incision: {Exam; incision:21111123} DVT Evaluation: {Exam; dvt:2111122} Labs: Lab Results  Component Value Date   WBC 5.9 10/19/2022   HGB 9.6 (L) 10/19/2022   HCT 28.9 (L) 10/19/2022   MCV 91.7 10/19/2022   PLT 207 10/19/2022      Latest Ref Rng & Units 10/19/2022    5:12 AM  CMP  Glucose 70 - 99 mg/dL 88   BUN 6 - 20 mg/dL 8   Creatinine 4.78 - 2.95 mg/dL 6.21   Sodium 308 - 657 mmol/L 135   Potassium 3.5 - 5.1 mmol/L 4.0   Chloride 98 - 111 mmol/L 104   CO2 22 - 32 mmol/L 20   Calcium 8.9 - 10.3 mg/dL 8.3   Total Protein 6.5 - 8.1 g/dL 6.5   Total Bilirubin 0.3 - 1.2 mg/dL 0.9   Alkaline Phos 38 - 126 U/L 58   AST 15 - 41 U/L 17   ALT 0 - 44 U/L 8    Edinburgh Score:    08/12/2018    5:53 PM  Edinburgh Postnatal Depression Scale Screening Tool  I have been able to laugh and see the funny side of things. 1  I have looked forward with enjoyment to things. 0  I have blamed myself unnecessarily when things went wrong. 1  I have been anxious or worried for no good reason. 0  I have felt scared or panicky for no good reason. 1  Things have been getting on top of me. 0  I have been  so unhappy that I have had difficulty sleeping. 3  I have felt sad or miserable. 1  I have been so unhappy that I have been crying. 1  The thought of harming myself has occurred to me. 0  Edinburgh Postnatal Depression Scale Total 8   No data recorded  After visit meds:  Allergies as of 10/20/2022       Reactions   Penicillins Rash     Med Rec must be completed prior to using this [facility]***        Discharge home in stable condition Infant Feeding: Breast Infant Disposition:{CHL IP OB HOME WITH ZOXWRU:04540} Discharge instruction: per After Visit Summary and Postpartum booklet. Activity: Advance as tolerated. Pelvic rest for 6 weeks.  Diet: routine diet Future Appointments:No future  appointments. Follow up Visit:  Follow-up Information     [facility] Follow up in 6 week(s).   Specialty: Obstetrics and Gynecology Contact information: [address] [place] [address] [phone]               Message sent to [facility] on 9/27 by Dr. Para March  Please schedule this patient for a In person postpartum visit in 6 weeks with the following provider: Any provider. Additional Postpartum F/U:BP check 1 week  High risk pregnancy complicated by: HTN Delivery mode:  Vaginal, Spontaneous Anticipated Birth Control:  {Birth Control:23956}   10/20/2022 Milas Hock, MD

## 2022-10-21 MED ORDER — MEDROXYPROGESTERONE ACETATE 150 MG/ML IM SUSP
150.0000 mg | Freq: Once | INTRAMUSCULAR | Status: AC | PRN
  Administered 2022-10-21: 150 mg via INTRAMUSCULAR
  Filled 2022-10-21: qty 1

## 2022-10-21 MED ORDER — FERROUS SULFATE 325 (65 FE) MG PO TABS
325.0000 mg | ORAL_TABLET | ORAL | Status: DC
  Administered 2022-10-21: 325 mg via ORAL
  Filled 2022-10-21: qty 1

## 2022-10-21 NOTE — Anesthesia Postprocedure Evaluation (Signed)
Anesthesia Post Note  Patient: Michele Fox  Procedure(s) Performed: AN AD HOC LABOR EPIDURAL     Patient location during evaluation: Mother Baby Anesthesia Type: Epidural Level of consciousness: awake and alert and oriented Pain management: satisfactory to patient Vital Signs Assessment: post-procedure vital signs reviewed and stable Respiratory status: respiratory function stable Cardiovascular status: stable Postop Assessment: no headache, no backache, epidural receding, patient able to bend at knees, no signs of nausea or vomiting, adequate PO intake and able to ambulate Anesthetic complications: no   No notable events documented.  Last Vitals:  Vitals:   10/20/22 2048 10/21/22 0514  BP: (!) 120/47 128/65  Pulse: (!) 59 (!) 56  Resp: 18 18  Temp: 37.1 C 36.6 C  SpO2: 100% 100%    Last Pain:  Vitals:   10/21/22 0514  TempSrc: Oral  PainSc: 8    Pain Goal:                   Viona Hosking

## 2022-10-21 NOTE — Lactation Note (Addendum)
This note was copied from a baby's chart. Lactation Consultation Note  Patient Name: Michele Fox [id number] Date: 10/21/2022 Age:25 hours Mother Covid + Reason for consult: Follow-up assessment;Early term 37-38.6wks;Infant < 6lbs  P2- FOB came out asking for [facility] to come to room because infant was choking. LC quickly put on PPE and went into room. Infant was crying and had milk coming out of her mouth and nose. Infant's skin was red, but not purple/blue. LC demonstrated how to use the bulb syringe to help clear emesis when patting the back does not help enough. MOB stated that this was the worst she has choked, but she does it a little with each feeding. LC noted that MOB was using a yellow top formula nipple. LC switched the nipple to a white slow flow nipple. LC also demonstrated how to pace feed infant.  MOB has been formula feeding only, but she plans on exclusively pumping. Per MOB, she has been pumping in the hospital. [facility] encouraged MOB to continue with the frequent stimulation. LC also encouraged MOB to call for further assistance if needed. RN was notified of situation with infant.  Feeding Mother's Current Feeding Choice: Breast Milk and Formula Nipple Type: Slow - flow  Lactation Tools Discussed/Used Tools: Pump;Flanges Breast pump type: Double-Electric Breast Pump;Manual Pump Education: Setup, frequency, and cleaning;Milk Storage  Interventions Interventions: Breast feeding basics reviewed;DEBP;Education;Pace feeding;LC Services brochure  Discharge Discharge Education: Warning signs for feeding baby Pump: DEBP;Personal  Consult Status Consult Status: Follow-up Date: 10/22/22 Follow-up type: In-patient    Dema Severin BS, IBCLC 10/21/2022, 1:32 PM

## 2022-10-21 NOTE — Social Work (Signed)
CSW received a consult, MOB requesting a place for the infant to sleep. CSW completed assessment over the phone due to MOB being on precautions. CSW inquired about MOB's needs for the infant, MOB reported she does not have a crib or bassinet, CSW notified MOB a Pack n play could be provided for the infant to sleep. MOB agreed and was appreciative. CSW inquired about food resources, MOB reported she receives [facility] and food stamps.   CSW provided education regarding the baby blues period vs. perinatal mood disorders, discussed treatment and gave resources for mental health follow up if concerns arise.  CSW recommends self-evaluation during the postpartum time period using the New Mom Checklist from Postpartum Progress and encouraged MOB to contact a medical professional if symptoms are noted at any time.  MOB identified FOB and her mom as her supports.  CSW provided review of Sudden Infant Death Syndrome (SIDS) precautions.  MOB reported she has all other necessary items for the infant including car seat.  CSW identifies no further need for intervention and no barriers to discharge at this time.  Wende Neighbors, LCSWA Clinical Social Worker [phone]

## 2022-10-21 NOTE — Progress Notes (Addendum)
POSTPARTUM PROGRESS NOTE  Subjective: Michele Fox is a 27 y.o. [id number] s/p SVD at [redacted]w[redacted]d.  She reports she doing well. No acute events overnight. She denies any problems with ambulating, voiding or po intake. Denies nausea or vomiting.  Pain is well controlled.  Lochia is appropriate.  Objective: Blood pressure 128/65, pulse (!) 56, temperature 97.9 F (36.6 C), temperature source Oral, resp. rate 18, last menstrual period 01/29/2022, SpO2 100%, unknown if currently breastfeeding.  Today's Vitals   10/20/22 2048 10/20/22 2345 10/21/22 0330 10/21/22 0514  BP: (!) 120/47   128/65  Pulse: (!) 59   (!) 56  Resp: 18   18  Temp: 98.7 F (37.1 C)   97.9 F (36.6 C)  TempSrc: Oral   Oral  SpO2: 100%   100%  PainSc: 0-No pain 4  4  8     There is no height or weight on file to calculate BMI.   Physical Exam:  General: alert, cooperative and no distress Chest: no respiratory distress Abdomen: soft, non-tender  Uterine Fundus: firm and at level of umbilicus Extremities: No calf swelling or tenderness  minimal edema  Recent Labs    10/19/22 0512  HGB 9.6*  HCT 28.9*    Assessment/Plan: [name] is a 27 y.o. [id number] s/p SVD at [redacted]w[redacted]d for IOL 2/2 to GFR and developed intrapartum gHTN.  Routine Postpartum Care: Doing well, pain well-controlled.  -- Continue routine care, lactation support  -- Contraception: depo shot -- Feeding: breast   gHTN: Patients normotensive past 24 hours.  - lasix 20 mg for 5 days   #Anemia: Hg 9.6 pp - PO iron started  Dispo: Plan for discharge tomorrow.  Penne Lash, MD Faculty Practice, Center for [facility] 10/21/2022 8:43 AM    Attestation of Attending Supervision of Resident: Evaluation and management procedures were performed by the [facility] Medicine Resident under my supervision. I was immediately available for direct supervision, assistance and direction throughout this encounter.  I also confirm that I have verified  the information documented in the resident's note, and that I have also personally reperformed the pertinent components of the physical exam and all of the medical decision making activities.  I have also made any necessary editorial changes.  Warden Fillers, MD Attending Obstetrician & Gynecologist, [facility] for [facility], [facility] 10/21/2022 9:31 AM

## 2022-10-22 MED ORDER — OXYCODONE HCL 5 MG PO TABS: 5.0000 mg | ORAL_TABLET | Freq: Four times a day (QID) | ORAL | 0 refills | Status: DC | PRN

## 2022-10-22 MED ORDER — SENNOSIDES-DOCUSATE SODIUM 8.6-50 MG PO TABS: 2.0000 | ORAL_TABLET | Freq: Every day | ORAL | Status: DC

## 2022-10-22 MED ORDER — FUROSEMIDE 20 MG PO TABS: 20.0000 mg | ORAL_TABLET | Freq: Every day | ORAL | 0 refills | Status: DC

## 2022-10-22 MED ORDER — SIMETHICONE 80 MG PO CHEW: 80.0000 mg | CHEWABLE_TABLET | ORAL | Status: DC | PRN

## 2022-10-22 MED ORDER — IBUPROFEN 600 MG PO TABS: 600.0000 mg | ORAL_TABLET | Freq: Four times a day (QID) | ORAL | 0 refills | Status: DC

## 2022-10-22 MED ORDER — MEDROXYPROGESTERONE ACETATE 150 MG/ML IM SUSP: 150.0000 mg | Freq: Once | INTRAMUSCULAR | Status: DC

## 2022-10-22 MED ORDER — WITCH HAZEL-GLYCERIN EX PADS: 1.0000 | MEDICATED_PAD | CUTANEOUS | Status: DC | PRN

## 2022-10-22 MED ORDER — COCONUT OIL OIL: 1.0000 | TOPICAL_OIL | Status: DC | PRN

## 2022-10-22 MED ORDER — FERROUS SULFATE 325 (65 FE) MG PO TABS: 325.0000 mg | ORAL_TABLET | ORAL | 2 refills | Status: DC

## 2022-10-22 MED ORDER — ACETAMINOPHEN 325 MG PO TABS: 650.0000 mg | ORAL_TABLET | ORAL | 0 refills | Status: DC | PRN

## 2022-10-23 LAB — SURGICAL PATHOLOGY

## 2022-10-27 ENCOUNTER — Ambulatory Visit: Payer: Medicaid Other

## 2022-10-30 ENCOUNTER — Ambulatory Visit: Payer: Medicaid Other

## 2022-10-30 VITALS — BP 146/93 | HR 60 | Wt 248.0 lb

## 2022-10-30 DIAGNOSIS — Z0130 Encounter for examination of blood pressure without abnormal findings: Secondary | ICD-10-CM

## 2022-10-30 NOTE — Progress Notes (Signed)
Subjective:  Michele Fox is a 27 y.o. female here for BP check.   Hypertension ROS: taking medications as instructed, no medication side effects noted, no TIA's, no chest pain on exertion, no dyspnea on exertion, and no swelling of ankles.    Objective:  LMP 01/29/2022 (Exact Date)   Appearance alert, well appearing, and in no distress. General exam BP noted to be well controlled today in office.    Assessment:   Blood Pressure control uncertain.   Plan:  Patient to call with repeat BP check to determine treatment plan .

## 2022-11-06 ENCOUNTER — Ambulatory Visit: Payer: Medicaid Other

## 2022-11-15 ENCOUNTER — Telehealth (HOSPITAL_COMMUNITY): Payer: Self-pay | Admitting: *Deleted

## 2022-11-15 NOTE — Telephone Encounter (Signed)
11/15/2022  Name: Teila Moczygemba MRN: [id number] DOB: 1995-02-16  Reason for Call:  Transition of Care Hospital Discharge Call  Contact Status: Patient Contact Status: Message  Language assistant needed:          Follow-Up Questions:    Inocente Salles Postnatal Depression Scale:  In the Past 7 Days:    PHQ2-9 Depression Scale:     Discharge Follow-up:    Post-discharge interventions: Reviewed Newborn Safe Sleep Practices  Salena Saner, RN 11/15/2022 15:57

## 2022-12-05 ENCOUNTER — Ambulatory Visit: Payer: Medicaid Other | Admitting: Advanced Practice Midwife

## 2023-01-08 ENCOUNTER — Ambulatory Visit: Payer: Medicaid Other | Admitting: Obstetrics and Gynecology

## 2023-04-03 DIAGNOSIS — Z30012 Encounter for prescription of emergency contraception: Secondary | ICD-10-CM | POA: Diagnosis not present

## 2023-07-02 ENCOUNTER — Ambulatory Visit
Admission: EM | Admit: 2023-07-02 | Discharge: 2023-07-02 | Disposition: A | Discharge status: Home / Self Care | Attending: Family Medicine | Admitting: Family Medicine

## 2023-07-02 ENCOUNTER — Other Ambulatory Visit: Payer: Self-pay

## 2023-07-02 DIAGNOSIS — Z3201 Encounter for pregnancy test, result positive: Secondary | ICD-10-CM | POA: Diagnosis not present

## 2023-07-02 LAB — POCT URINE PREGNANCY: Preg Test, Ur: POSITIVE — AB

## 2023-07-02 NOTE — ED Provider Notes (Signed)
 [facility]    CSN: [id number] Arrival date & time: 07/02/23  1338      History   Chief Complaint No chief complaint on file.   HPI Michele Fox is a 28 y.o. female presents for possible pregnancy.  Patient reports her last menstrual cycle was end of April early May.  She is not on any type of birth control but states she has taken 2 rounds of Plan B at the beginning of May.  States she did have a negative pregnancy test until last week when she had a positive test.  She has since taken 2 that were positive but she wants to confirm.  No abdominal pain or vaginal bleeding.  No other concerns at this time.  HPI  Past Medical History:  Diagnosis Date   Group beta Strep positive 08/12/2018   Incomplete uterine prolapse 08/12/2018   With delivery   Postpartum hemorrhage 08/12/2018   Uterine inversion associated with pregnancy 08/16/2018    Patient Active Problem List   Diagnosis Date Noted   Gestational hypertension 10/20/2022   Pregnancy 10/19/2022   Fetal growth restriction antepartum 10/19/2022   Group B Streptococcus carrier, +RV culture, currently pregnant 10/09/2022   Abnormal fetal ultrasound (Absent nasal bone, EIF) 08/29/2022   Obesity affecting pregnancy 06/12/2022   Supervision of other normal pregnancy, antepartum 04/04/2022   Uterine inversion associated with pregnancy 08/16/2018    Past Surgical History:  Procedure Laterality Date   UTERINE SEPTUM RESECTION N/A 08/17/2018   Procedure: REVERSAL OF UTERINE INVERSION WITH PLACEMENT BAKRI BALLOON;  Surgeon: Albino Hum, MD;  Location: [facility] OR;  Service: Gynecology;  Laterality: N/A;    OB History     Gravida  3   Para  2   Term  2   Preterm      AB      Living  2      SAB      IAB      Ectopic      Multiple  0   Live Births  2            Home Medications    Prior to Admission medications   Medication Sig Start Date End Date Taking? Authorizing Provider  cetirizine   (ZYRTEC  ALLERGY) 10 MG tablet Take 1 tablet (10 mg total) by mouth daily. Patient taking differently: Take 10 mg by mouth daily as needed for allergies. 05/30/22   Gabrielle Joiner, MD  ferrous sulfate  325 (65 FE) MG EC tablet Take 1 tablet (325 mg total) by mouth daily with breakfast. 09/03/22   Denzil Flatten, NP  ferrous sulfate  325 (65 FE) MG tablet Take 1 tablet (325 mg total) by mouth every other day. 10/23/22   Abigail Abler, MD  furosemide  (LASIX ) 20 MG tablet Take 1 tablet (20 mg total) by mouth daily for 5 days. 10/22/22 10/27/22  Abigail Abler, MD  ibuprofen  (ADVIL ) 600 MG tablet Take 1 tablet (600 mg total) by mouth every 6 (six) hours. 10/22/22   Leveque, Alyssa, MD  oxyCODONE  (OXY IR/ROXICODONE ) 5 MG immediate release tablet Take 1 tablet (5 mg total) by mouth every 6 (six) hours as needed (pain scale 4-7). 10/22/22   Abigail Abler, MD  Prenatal Vit-Fe Fumarate-FA (PRENATAL PO) Take 1 tablet by mouth daily.    [provider]  enalapril  (VASOTEC ) 5 MG tablet Take 1 tablet (5 mg total) by mouth daily. 08/19/18 12/10/18  Ervin, Michael L, MD    Family  History Family History  Problem Relation Age of Onset   Hypertension Mother    Gout Mother    Diabetes Father    Diabetes Paternal Grandmother     Social History Social History   Tobacco Use   Smoking status: Never   Smokeless tobacco: Never  Vaping Use   Vaping status: Never Used  Substance Use Topics   Alcohol use: Not Currently    Comment: occ, prior to pregnancy   Drug use: Never     Allergies   Penicillins   Review of Systems Review of Systems  Genitourinary:  Positive for menstrual problem.     Physical Exam Triage Vital Signs ED Triage Vitals  Encounter Vitals Group     BP 07/02/23 1421 (!) 142/80     Systolic BP Percentile --      Diastolic BP Percentile --      Pulse Rate 07/02/23 1421 78     Resp 07/02/23 1421 16     Temp 07/02/23 1421 98.6 F (37 C)     Temp Source 07/02/23 1421  Oral     SpO2 07/02/23 1421 98 %     Weight --      Height --      Head Circumference --      Peak Flow --      Pain Score 07/02/23 1419 0     Pain Loc --      Pain Education --      Exclude from Growth Chart --    No data found.  Updated Vital Signs BP (!) 142/80   Pulse 78   Temp 98.6 F (37 C) (Oral)   Resp 16   LMP 05/28/2023 (Approximate)   SpO2 98%   Breastfeeding No   Visual Acuity Right Eye Distance:   Left Eye Distance:   Bilateral Distance:    Right Eye Near:   Left Eye Near:    Bilateral Near:     Physical Exam Vitals and nursing note reviewed.  Constitutional:      Appearance: Normal appearance.  HENT:     Head: Normocephalic and atraumatic.  Eyes:     Pupils: Pupils are equal, round, and reactive to light.  Cardiovascular:     Rate and Rhythm: Normal rate.  Pulmonary:     Effort: Pulmonary effort is normal.  Skin:    General: Skin is warm and dry.  Neurological:     General: No focal deficit present.     Mental Status: She is alert and oriented to person, place, and time.  Psychiatric:        Mood and Affect: Mood normal.        Behavior: Behavior normal.      UC Treatments / Results  Labs (all labs ordered are listed, but only abnormal results are displayed) Labs Reviewed  POCT URINE PREGNANCY - Abnormal; Notable for the following components:      Result Value   Preg Test, Ur Positive (*)    All other components within normal limits    EKG   Radiology No results found.  Procedures Procedures (including critical care time)  Medications Ordered in UC Medications - No data to display  Initial Impression / Assessment and Plan / UC Course  I have reviewed the triage vital signs and the nursing notes.  Pertinent labs & imaging results that were available during my care of the patient were reviewed by me and considered in my medical decision making (see chart  for details).     Positive urine hCG.  Discussed with patient.   Advised establishing with OB for prenatal care. Final Clinical Impressions(s) / UC Diagnoses   Final diagnoses:  Positive urine pregnancy test   Discharge Instructions      Your urine pregnancy test was positive.  Please establish with an obstetrician for your prenatal care.  ED Prescriptions   None    PDMP not reviewed this encounter.   Alleen Arbour, NP 07/02/23 1440

## 2023-07-02 NOTE — ED Triage Notes (Signed)
 Pt states took two pregnancy tests and they were positive.

## 2023-07-02 NOTE — Discharge Instructions (Addendum)
 Your urine pregnancy test was positive.  Please establish with an obstetrician for your prenatal care.

## 2023-08-07 ENCOUNTER — Other Ambulatory Visit: Payer: Self-pay

## 2023-08-07 DIAGNOSIS — O219 Vomiting of pregnancy, unspecified: Secondary | ICD-10-CM

## 2023-08-07 MED ORDER — PROMETHAZINE HCL 25 MG PO TABS
25.0000 mg | ORAL_TABLET | Freq: Four times a day (QID) | ORAL | 0 refills | Status: DC | PRN
Start: 2023-08-07 — End: 2023-08-14

## 2023-08-14 ENCOUNTER — Other Ambulatory Visit: Payer: Self-pay

## 2023-08-14 DIAGNOSIS — O219 Vomiting of pregnancy, unspecified: Secondary | ICD-10-CM

## 2023-08-14 MED ORDER — PROMETHAZINE HCL 25 MG PO TABS: 25.0000 mg | ORAL_TABLET | Freq: Four times a day (QID) | ORAL | 0 refills | Status: DC | PRN

## 2023-08-15 ENCOUNTER — Other Ambulatory Visit (HOSPITAL_COMMUNITY)
Admission: RE | Admit: 2023-08-15 | Discharge: 2023-08-15 | Disposition: A | Discharge status: Home / Self Care | Source: Ambulatory Visit | Attending: Obstetrics and Gynecology | Admitting: Obstetrics and Gynecology

## 2023-08-15 ENCOUNTER — Ambulatory Visit (INDEPENDENT_AMBULATORY_CARE_PROVIDER_SITE_OTHER): Admitting: *Deleted

## 2023-08-15 ENCOUNTER — Other Ambulatory Visit (INDEPENDENT_AMBULATORY_CARE_PROVIDER_SITE_OTHER): Payer: Self-pay

## 2023-08-15 VITALS — BP 114/71 | HR 61 | Ht 65.0 in | Wt 256.8 lb

## 2023-08-15 DIAGNOSIS — Z3A12 12 weeks gestation of pregnancy: Secondary | ICD-10-CM

## 2023-08-15 DIAGNOSIS — O0991 Supervision of high risk pregnancy, unspecified, first trimester: Secondary | ICD-10-CM

## 2023-08-15 DIAGNOSIS — Z1331 Encounter for screening for depression: Secondary | ICD-10-CM | POA: Diagnosis not present

## 2023-08-15 DIAGNOSIS — O0990 Supervision of high risk pregnancy, unspecified, unspecified trimester: Secondary | ICD-10-CM | POA: Insufficient documentation

## 2023-08-15 DIAGNOSIS — O3680X0 Pregnancy with inconclusive fetal viability, not applicable or unspecified: Secondary | ICD-10-CM

## 2023-08-15 DIAGNOSIS — O219 Vomiting of pregnancy, unspecified: Secondary | ICD-10-CM

## 2023-08-15 MED ORDER — VITAFOL GUMMIES 3.33-0.333-34.8 MG PO CHEW
3.0000 | CHEWABLE_TABLET | Freq: Every day | ORAL | 11 refills | Status: AC
Start: 2023-08-15 — End: ?

## 2023-08-15 MED ORDER — BLOOD PRESSURE KIT DEVI: 1.0000 | 0 refills | Status: DC

## 2023-08-15 MED ORDER — PROMETHAZINE HCL 25 MG PO TABS: 25.0000 mg | ORAL_TABLET | Freq: Four times a day (QID) | ORAL | 0 refills | Status: DC | PRN

## 2023-08-15 NOTE — Progress Notes (Unsigned)
 New OB Intake  I connected with Dorien Hoar  on 08/15/23 at  8:15 AM EDT by {Contact:24193} Video Visit and verified that I am speaking with the correct person using two identifiers. Nurse is located at [facility] and pt is located at ***.  I discussed the limitations, risks, security and privacy concerns of performing an evaluation and management service by telephone and the availability of in person appointments. I also discussed with the patient that there may be a patient responsible charge related to this service. The patient expressed understanding and agreed to proceed.  I explained I am completing New OB Intake today. We discussed EDD of *** based on {EDD:33166}. Pt is G3P2002. I reviewed her allergies, medications and Medical/Surgical/OB history.    Patient Active Problem List   Diagnosis Date Noted   Gestational hypertension 10/20/2022   Pregnancy 10/19/2022   Fetal growth restriction antepartum 10/19/2022   Group B Streptococcus carrier, +RV culture, currently pregnant 10/09/2022   Abnormal fetal ultrasound (Absent nasal bone, EIF) 08/29/2022   Obesity affecting pregnancy 06/12/2022   Supervision of other normal pregnancy, antepartum 04/04/2022   Uterine inversion associated with pregnancy 08/16/2018     Concerns addressed today  Delivery Plans Plans to deliver at [facility]. Discussed the nature of our practice with multiple providers including residents and students. Due to the size of the practice, the delivering provider may not be the same as those providing prenatal care.   Patient is interested in water birth.  MyChart/Babyscripts MyChart access verified. I explained pt will have some visits in office and some virtually. Babyscripts instructions given and order placed. Patient verifies receipt of registration text/e-mail. Account successfully created and app downloaded. If patient is a candidate for Optimized scheduling, add to sticky note.   Blood Pressure  Cuff/Weight Scale Blood pressure cuff ordered for patient to pick-up from [employer]. Explained after first prenatal appt pt will check weekly and document in Babyscripts. Patient does not have weight scale; patient may purchase if they desire to track weight weekly in Babyscripts.  Anatomy US  Explained first scheduled US  will be around 19 weeks. Anatomy US  scheduled for *** at ***.  Is patient a CenteringPregnancy candidate?  {Accepted:19197::Accepted,Not a Candidate,Declined} Declined due to {Declined:19197::Schedule,Childcare,Group setting,Support person concern,Declined to say,Enrolled in [facility],***} Not a candidate due to {Not a Candidate:19197::DM,CHTN, medication controlled,Language barrier,>28 weeks,Multiple gestation (mono-mono or mono-di),Complex coordination of care needed,***} If accepted,    Is patient a Mom+Baby Combined Care candidate?  {Accepted:19197::Accepted,Declined,Not a candidate,***}   If accepted, confirm patient does not intend to move from the area for at least 12 months, then notify Mom+Baby staff  Is patient a candidate for Babyscripts Optimization? {babyscripts:31704}   First visit review I reviewed new OB appt with patient. Explained pt will be seen by *** at first visit. Discussed [place] genetic screening with patient. *** [employer] and [employer].. Routine prenatal labs {collected today/needed at new OB visit:9024}   Last Pap Diagnosis  Date Value Ref Range Status  04/27/2022   Final   - Negative for intraepithelial lesion or malignancy (NILM)    Rocky CHRISTELLA Ober, RN 08/15/2023  8:16 AM

## 2023-08-15 NOTE — Patient Instructions (Signed)

## 2023-08-16 ENCOUNTER — Ambulatory Visit: Payer: Self-pay | Admitting: Obstetrics and Gynecology

## 2023-08-16 DIAGNOSIS — O99019 Anemia complicating pregnancy, unspecified trimester: Secondary | ICD-10-CM

## 2023-08-16 LAB — CERVICOVAGINAL ANCILLARY ONLY
Chlamydia: NEGATIVE
Comment: NEGATIVE
Comment: NORMAL
Neisseria Gonorrhea: NEGATIVE

## 2023-08-16 LAB — COMPREHENSIVE METABOLIC PANEL WITH GFR
ALT: 9 IU/L (ref 0–32)
AST: 7 IU/L (ref 0–40)
Albumin: 4.1 g/dL (ref 4.0–5.0)
Alkaline Phosphatase: 44 IU/L (ref 44–121)
BUN/Creatinine Ratio: 13 (ref 9–23)
BUN: 9 mg/dL (ref 6–20)
Bilirubin Total: 0.3 mg/dL (ref 0.0–1.2)
CO2: 18 mmol/L — ABNORMAL LOW (ref 20–29)
Calcium: 9.2 mg/dL (ref 8.7–10.2)
Chloride: 102 mmol/L (ref 96–106)
Creatinine, Ser: 0.69 mg/dL (ref 0.57–1.00)
Globulin, Total: 3 g/dL (ref 1.5–4.5)
Glucose: 82 mg/dL (ref 70–99)
Potassium: 4.1 mmol/L (ref 3.5–5.2)
Sodium: 135 mmol/L (ref 134–144)
Total Protein: 7.1 g/dL (ref 6.0–8.5)
eGFR: 123 mL/min/1.73 (ref >59)

## 2023-08-16 LAB — CBC/D/PLT+RPR+RH+ABO+RUBIGG...
Antibody Screen: NEGATIVE
Basophils Absolute: 0 x10E3/uL (ref 0.0–0.2)
Basos: 1 %
EOS (ABSOLUTE): 0 x10E3/uL (ref 0.0–0.4)
Eos: 1 %
HCV Ab: NONREACTIVE
HIV Screen 4th Generation wRfx: NONREACTIVE
Hematocrit: 34.9 % (ref 34.0–46.6)
Hemoglobin: 11 g/dL — ABNORMAL LOW (ref 11.1–15.9)
Hepatitis B Surface Ag: NEGATIVE
Immature Grans (Abs): 0 x10E3/uL (ref 0.0–0.1)
Immature Granulocytes: 0 %
Lymphocytes Absolute: 1.7 x10E3/uL (ref 0.7–3.1)
Lymphs: 45 %
MCH: 30.1 pg (ref 26.6–33.0)
MCHC: 31.5 g/dL (ref 31.5–35.7)
MCV: 95 fL (ref 79–97)
Monocytes Absolute: 0.5 x10E3/uL (ref 0.1–0.9)
Monocytes: 13 %
Neutrophils Absolute: 1.4 x10E3/uL (ref 1.4–7.0)
Neutrophils: 40 %
Platelets: 158 x10E3/uL (ref 150–450)
RBC: 3.66 x10E6/uL — ABNORMAL LOW (ref 3.77–5.28)
RDW: 11.8 % (ref 11.7–15.4)
RPR Ser Ql: NONREACTIVE
Rh Factor: POSITIVE
Rubella Antibodies, IGG: 6.71 {index} (ref >0.99)
WBC: 3.7 x10E3/uL (ref 3.4–10.8)

## 2023-08-16 LAB — HEMOGLOBIN A1C
Est. average glucose Bld gHb Est-mCnc: 114 mg/dL
Hgb A1c MFr Bld: 5.6 % (ref 4.8–5.6)

## 2023-08-16 LAB — HCV INTERPRETATION

## 2023-08-16 MED ORDER — FERROUS SULFATE 325 (65 FE) MG PO TBEC: 325.0000 mg | DELAYED_RELEASE_TABLET | ORAL | 3 refills | Status: DC

## 2023-08-17 LAB — CULTURE, OB URINE

## 2023-08-17 LAB — URINE CULTURE, OB REFLEX

## 2023-08-21 LAB — PANORAMA PRENATAL TEST FULL PANEL:PANORAMA TEST PLUS 5 ADDITIONAL MICRODELETIONS: FETAL FRACTION: 14.8

## 2023-08-22 ENCOUNTER — Ambulatory Visit (INDEPENDENT_AMBULATORY_CARE_PROVIDER_SITE_OTHER): Admitting: Obstetrics and Gynecology

## 2023-08-22 ENCOUNTER — Other Ambulatory Visit (HOSPITAL_COMMUNITY)
Admission: RE | Admit: 2023-08-22 | Discharge: 2023-08-22 | Disposition: A | Discharge status: Home / Self Care | Source: Ambulatory Visit | Attending: Obstetrics and Gynecology | Admitting: Obstetrics and Gynecology

## 2023-08-22 VITALS — BP 117/77 | HR 72 | Wt 266.0 lb

## 2023-08-22 DIAGNOSIS — Z8759 Personal history of other complications of pregnancy, childbirth and the puerperium: Secondary | ICD-10-CM

## 2023-08-22 DIAGNOSIS — O99210 Obesity complicating pregnancy, unspecified trimester: Secondary | ICD-10-CM

## 2023-08-22 DIAGNOSIS — Z1331 Encounter for screening for depression: Secondary | ICD-10-CM | POA: Diagnosis not present

## 2023-08-22 DIAGNOSIS — Z3A13 13 weeks gestation of pregnancy: Secondary | ICD-10-CM | POA: Diagnosis not present

## 2023-08-22 DIAGNOSIS — O09291 Supervision of pregnancy with other poor reproductive or obstetric history, first trimester: Secondary | ICD-10-CM | POA: Diagnosis not present

## 2023-08-22 DIAGNOSIS — O99211 Obesity complicating pregnancy, first trimester: Secondary | ICD-10-CM

## 2023-08-22 DIAGNOSIS — N898 Other specified noninflammatory disorders of vagina: Secondary | ICD-10-CM

## 2023-08-22 DIAGNOSIS — O09299 Supervision of pregnancy with other poor reproductive or obstetric history, unspecified trimester: Secondary | ICD-10-CM | POA: Insufficient documentation

## 2023-08-22 DIAGNOSIS — Z8742 Personal history of other diseases of the female genital tract: Secondary | ICD-10-CM

## 2023-08-22 DIAGNOSIS — O0990 Supervision of high risk pregnancy, unspecified, unspecified trimester: Secondary | ICD-10-CM

## 2023-08-22 DIAGNOSIS — Z6841 Body Mass Index (BMI) 40.0 and over, adult: Secondary | ICD-10-CM | POA: Diagnosis not present

## 2023-08-22 DIAGNOSIS — O09899 Supervision of other high risk pregnancies, unspecified trimester: Secondary | ICD-10-CM | POA: Insufficient documentation

## 2023-08-22 DIAGNOSIS — O09891 Supervision of other high risk pregnancies, first trimester: Secondary | ICD-10-CM | POA: Diagnosis not present

## 2023-08-22 MED ORDER — ACCRUFER 30 MG PO CAPS: 1.0000 | ORAL_CAPSULE | Freq: Two times a day (BID) | ORAL | 5 refills | Status: DC

## 2023-08-22 MED ORDER — ASPIRIN 81 MG PO CHEW: 81.0000 mg | CHEWABLE_TABLET | Freq: Every day | ORAL | 4 refills | Status: DC

## 2023-08-22 NOTE — Progress Notes (Addendum)
 NOB, c/o white vaginal discharge, itching. Swabs done 7/23 Negative.

## 2023-08-22 NOTE — Progress Notes (Signed)
 INITIAL PRENATAL VISIT NOTE  Subjective:  Michele Fox is a 28 y.o. G3P2002 at [redacted]w[redacted]d by early ultrasound being seen today for her initial prenatal visit. She has an obstetric history significant for SVD x 2 with postpartum hemorrhage and uterine inversion. She has an uncomplicated medical history.  Patient reports no complaints.  Contractions: Not present. Vag. Bleeding: None.   . Denies leaking of fluid.    Past Medical History:  Diagnosis Date   Anemia    Group beta Strep positive 08/12/2018   Incomplete uterine prolapse 08/12/2018   With delivery   Postpartum hemorrhage 08/12/2018   Uterine inversion associated with pregnancy 08/16/2018    Past Surgical History:  Procedure Laterality Date   UTERINE SEPTUM RESECTION N/A 08/17/2018   Procedure: REVERSAL OF UTERINE INVERSION WITH PLACEMENT BAKRI BALLOON;  Surgeon: Edsel Norleen GAILS, MD;  Location: [facility] OR;  Service: Gynecology;  Laterality: N/A;    OB History  Gravida Para Term Preterm AB Living  3 2 2  0 0 2  SAB IAB Ectopic Multiple Live Births  0 0 0 0 2    # Outcome Date GA Lbr Len/2nd Weight Sex Type Anes PTL Lv  3 Current           2 Term 10/20/22 [redacted]w[redacted]d / 00:42 5 lb (2.267 kg) F Vag-Spont EPI  LIV     Birth Comments: WDL  1 Term 08/12/18 [redacted]w[redacted]d 11:45 / 00:29 6 lb 5.4 oz (2.875 kg) M Vag-Spont EPI  LIV    Social History   Socioeconomic History   Marital status: Single    Spouse name: Not on file   Number of children: Not on file   Years of education: Not on file   Highest education level: Not on file  Occupational History   Not on file  Tobacco Use   Smoking status: Never   Smokeless tobacco: Never  Vaping Use   Vaping status: Never Used  Substance and Sexual Activity   Alcohol use: Not Currently    Comment: occ, prior to pregnancy   Drug use: Never   Sexual activity: Yes    Partners: Male    Birth control/protection: None  Other Topics Concern   Not on file  Social History Narrative   Not on file    Social Drivers of Health   Financial Resource Strain: Not on file  Food Insecurity: No Food Insecurity (10/19/2022)   Hunger Vital Sign    Worried About Running Out of Food in the Last Year: Never true    Ran Out of Food in the Last Year: Never true  Transportation Needs: No Transportation Needs (10/19/2022)   PRAPARE - Administrator, Civil Service (Medical): No    Lack of Transportation (Non-Medical): No  Physical Activity: Not on file  Stress: Not on file  Social Connections: Not on file    Family History  Problem Relation Age of Onset   Arthritis Mother        rheumatoid   Hypertension Mother    Gout Mother    Diabetes Father    Diabetes Paternal Grandmother      Current Outpatient Medications:    aspirin  81 MG chewable tablet, Chew 1 tablet (81 mg total) by mouth daily., Disp: 90 tablet, Rfl: 4   Ferric Maltol  (ACCRUFER ) 30 MG CAPS, Take 1 capsule (30 mg total) by mouth in the morning and at bedtime., Disp: 60 capsule, Rfl: 5   Blood Pressure Monitoring (BLOOD PRESSURE KIT) DEVI,  1 Device by Does not apply route once a week., Disp: 1 each, Rfl: 0   furosemide  (LASIX ) 20 MG tablet, Take 1 tablet (20 mg total) by mouth daily for 5 days., Disp: 5 tablet, Rfl: 0   ibuprofen  (ADVIL ) 600 MG tablet, Take 1 tablet (600 mg total) by mouth every 6 (six) hours., Disp: 30 tablet, Rfl: 0   oxyCODONE  (OXY IR/ROXICODONE ) 5 MG immediate release tablet, Take 1 tablet (5 mg total) by mouth every 6 (six) hours as needed (pain scale 4-7)., Disp: 10 tablet, Rfl: 0   Prenatal Vit-Fe Fumarate-FA (PRENATAL PO), Take 1 tablet by mouth daily., Disp: , Rfl:    Prenatal Vit-Fe Phos-FA-Omega (VITAFOL  GUMMIES) 3.33-0.333-34.8 MG CHEW, Chew 3 tablets by mouth daily., Disp: 90 tablet, Rfl: 11   promethazine  (PHENERGAN ) 25 MG tablet, Take 1 tablet (25 mg total) by mouth every 6 (six) hours as needed for nausea or vomiting., Disp: 30 tablet, Rfl: 0  Allergies  Allergen Reactions   Penicillins  Rash    Review of Systems: Negative except for what is mentioned in HPI.  Objective:   Vitals:   08/22/23 0954  BP: 117/77  Pulse: 72  Weight: 266 lb (120.7 kg)    Fetal Status: Fetal Heart Rate (bpm): 157         Physical Exam: BP 117/77   Pulse 72   Wt 266 lb (120.7 kg)   LMP  (LMP Unknown)   BMI 44.26 kg/m  CONSTITUTIONAL: Well-developed, obese well-nourished female in no acute distress.  NEUROLOGIC: Alert and oriented to person, place, and time. Normal reflexes, muscle tone coordination. No cranial nerve deficit noted. PSYCHIATRIC: Normal mood and affect. Normal behavior. Normal judgment and thought content. SKIN: Skin is warm and dry. No rash noted. Not diaphoretic. No erythema. No pallor. HENT:  Normocephalic, atraumatic, External right and left ear normal. Oropharynx is clear and moist EYES: Conjunctivae and EOM are normal.  NECK: Normal range of motion, supple, no masses CARDIOVASCULAR: Normal heart rate noted, regular rhythm RESPIRATORY: Effort and breath sounds normal, no problems with respiration noted BREASTS: deferred ABDOMEN: Soft, nontender, nondistended, gravid. HL:izqzmmzi MUSCULOSKELETAL: Normal range of motion. EXT:  No edema and no tenderness. 2+ distal pulses.   Assessment and Plan:  Pregnancy: G3P2002 at [redacted]w[redacted]d by ultrasound  1. Supervision of high risk pregnancy, antepartum (Primary) Continue routine prenatal care  - Ambulatory referral to Integrated Behavioral Health - aspirin  81 MG chewable tablet; Chew 1 tablet (81 mg total) by mouth daily.  Dispense: 90 tablet; Refill: 4 - Ferric Maltol  (ACCRUFER ) 30 MG CAPS; Take 1 capsule (30 mg total) by mouth in the morning and at bedtime.  Dispense: 60 capsule; Refill: 5  2. Obesity affecting pregnancy, antepartum, unspecified obesity type   3. History of postpartum hemorrhage, currently pregnant TXA and other interventions present at time of delivery  4. BMI 40.0-44.9, adult (HCC)   5. Short  interval between pregnancies affecting pregnancy, antepartum   6. Vaginal irritation Vag swab today - Cervicovaginal ancillary only  7. History of gestational hypertension Start baby ASA  8. History of uterine inversion    Preterm labor symptoms and general obstetric precautions including but not limited to vaginal bleeding, contractions, leaking of fluid and fetal movement were reviewed in detail with the patient.  Please refer to After Visit Summary for other counseling recommendations.   Return in about 4 weeks (around 09/19/2023) for [facility], in person.  Jerilynn DELENA Buddle 08/22/2023 10:49 AM

## 2023-08-22 NOTE — Patient Instructions (Signed)
 Start baby Aspirin  at 15 weeks   Considering Waterbirth? Guide for patients at Center for [facility]) Why consider waterbirth? Gentle birth for babies  Less pain medicine used in labor  May allow for passive descent/less pushing  May reduce perineal tears  More mobility and instinctive maternal position changes  Increased maternal relaxation   Is waterbirth safe? What are the risks of infection, drowning or other complications? Infection:  Very low risk (3.7 % for tub vs 4.8% for bed)  7 in 8000 waterbirths with documented infection  Poorly cleaned equipment most common cause  Slightly lower group B strep transmission rate  Drowning  Maternal:  Very low risk  Related to seizures or fainting  Newborn:  Very low risk. No evidence of increased risk of respiratory problems in multiple large studies  Physiological protection from breathing under water  Avoid underwater birth if there are any fetal complications  Once baby's head is out of the water, keep it out.  Birth complication  Some reports of cord trauma, but risk decreased by bringing baby to surface gradually  No evidence of increased risk of shoulder dystocia. Mothers can usually change positions faster in water than in a bed, possibly aiding the maneuvers to free the shoulder.   There are 2 things you MUST do to have a waterbirth with [facility] Attend a waterbirth class at [facility] [place]   3rd Wednesday of every month from 7-9 pm (virtual during COVID) [employer] at [email or web address] or [email or web address] or by calling [phone] Bring us  the certificate from the class to your prenatal appointment or send via MyChart Meet with a midwife at 36 weeks* to see if you can still plan a waterbirth and to sign the consent.   *We also recommend that you schedule as many of your prenatal visits with a midwife as possible.    Helpful information: You may want to bring a  bathing suit top to the hospital to wear during labor but this is optional.  All other supplies are provided by the hospital. Please arrive at the hospital with signs of active labor, and do not wait at home until late in labor. It takes 45 min- 1 hour for fetal monitoring, and check in to your room to take place, plus transport and filling of the waterbirth tub.    Things that would prevent you from having a waterbirth: Premature, <37wks  Previous cesarean birth  Presence of thick meconium-stained fluid  Multiple gestation (Twins, triplets, etc.)  Uncontrolled diabetes or gestational diabetes requiring medication  Hypertension diagnosed in pregnancy or preexisting hypertension (gestational hypertension, preeclampsia, or chronic hypertension) Fetal growth restriction (your baby measures less than 10th percentile on ultrasound) Heavy vaginal bleeding  Non-reassuring fetal heart rate  Active infection (MRSA, etc.). Group B Strep is NOT a contraindication for waterbirth.  If your labor has to be induced and induction method requires continuous monitoring of the baby's heart rate  Other risks/issues identified by your obstetrical provider   Please remember that birth is unpredictable. Under certain unforeseeable circumstances your provider may advise against giving birth in the tub. These decisions will be made on a case-by-case basis and with the safety of you and your baby as our highest priority.    Updated 04/27/21

## 2023-08-23 ENCOUNTER — Ambulatory Visit: Payer: Self-pay | Admitting: Obstetrics and Gynecology

## 2023-08-23 LAB — CERVICOVAGINAL ANCILLARY ONLY
Bacterial Vaginitis (gardnerella): NEGATIVE
Candida Glabrata: NEGATIVE
Candida Vaginitis: POSITIVE — AB
Comment: NEGATIVE
Comment: NEGATIVE
Comment: NEGATIVE
Comment: NEGATIVE
Trichomonas: NEGATIVE

## 2023-08-24 ENCOUNTER — Telehealth: Payer: Self-pay

## 2023-08-24 MED ORDER — TERCONAZOLE 0.4 % VA CREA: 1.0000 | TOPICAL_CREAM | Freq: Every day | VAGINAL | 0 refills | Status: AC

## 2023-08-24 NOTE — Telephone Encounter (Signed)
 Attempt to call patient and voicemail not set up. Terconazole  prescription was sent to pharmacy on file per protocol. Patient will need to be notified of prescription and results.  B'Aisha, RMA

## 2023-08-24 NOTE — Telephone Encounter (Signed)
 Called patient 3x to get scheduled for Anatomy Scan. No answer from patient and no VM box to leave message.

## 2023-08-24 NOTE — Telephone Encounter (Signed)
-----   Message from Michele Fox sent at 08/23/2023  4:50 PM EDT ----- Vaginal swab positive for yeast ,offer treatment ----- Message ----- From: Interface, Lab In [facility] Sent: 08/23/2023   1:40 PM EDT To: Michele DELENA Buddle, MD

## 2023-09-19 ENCOUNTER — Ambulatory Visit: Admitting: Obstetrics and Gynecology

## 2023-09-19 VITALS — BP 120/76 | HR 68 | Wt 263.4 lb

## 2023-09-19 DIAGNOSIS — O09299 Supervision of pregnancy with other poor reproductive or obstetric history, unspecified trimester: Secondary | ICD-10-CM

## 2023-09-19 DIAGNOSIS — Z3A17 17 weeks gestation of pregnancy: Secondary | ICD-10-CM

## 2023-09-19 DIAGNOSIS — O09899 Supervision of other high risk pregnancies, unspecified trimester: Secondary | ICD-10-CM | POA: Diagnosis not present

## 2023-09-19 DIAGNOSIS — Z8742 Personal history of other diseases of the female genital tract: Secondary | ICD-10-CM

## 2023-09-19 DIAGNOSIS — Z6841 Body Mass Index (BMI) 40.0 and over, adult: Secondary | ICD-10-CM

## 2023-09-19 DIAGNOSIS — O0990 Supervision of high risk pregnancy, unspecified, unspecified trimester: Secondary | ICD-10-CM | POA: Diagnosis not present

## 2023-09-19 DIAGNOSIS — Z8759 Personal history of other complications of pregnancy, childbirth and the puerperium: Secondary | ICD-10-CM

## 2023-09-19 NOTE — Progress Notes (Signed)
   PRENATAL VISIT NOTE  Subjective:  Michele Fox is a 28 y.o. G3P2002 at [redacted]w[redacted]d being seen today for ongoing prenatal care.  She is currently monitored for the following issues for this high-risk pregnancy and has Supervision of other normal pregnancy, antepartum; Obesity affecting pregnancy; Group B Streptococcus carrier, +RV culture, currently pregnant; Pregnancy; Supervision of high risk pregnancy, antepartum; History of postpartum hemorrhage, currently pregnant; BMI 40.0-44.9, adult (HCC); Short interval between pregnancies affecting pregnancy, antepartum; History of gestational hypertension; and History of uterine inversion on their problem list.  Patient doing well with no acute concerns today. She reports no complaints.  Contractions: Not present. Vag. Bleeding: None.  Movement: Present (flutters). Denies leaking of fluid.   The following portions of the patient's history were reviewed and updated as appropriate: allergies, current medications, past family history, past medical history, past social history, past surgical history and problem list. Problem list updated.  Objective:   Vitals:   09/19/23 1558  BP: 120/76  Pulse: 68  Weight: 263 lb 6.4 oz (119.5 kg)    Fetal Status: Fetal Heart Rate (bpm): 146 Fundal Height: 17 cm Movement: Present (flutters)     General:  Alert, oriented and cooperative. Patient is in no acute distress.  Skin: Skin is warm and dry. No rash noted.   Cardiovascular: Normal heart rate noted  Respiratory: Normal respiratory effort, no problems with respiration noted  Abdomen: Soft, gravid, appropriate for gestational age.  Pain/Pressure: Present     Pelvic: Cervical exam deferred        Extremities: Normal range of motion.  Edema: Trace (feet/ankles)  Mental Status:  Normal mood and affect. Normal behavior. Normal judgment and thought content.   Assessment and Plan:  Pregnancy: G3P2002 at [redacted]w[redacted]d  1. [redacted] weeks gestation of pregnancy  (Primary)   2. History of uterine inversion Be aware at time of delivery  3. Supervision of high risk pregnancy, antepartum Continue routine prenatal care  - AFP, Serum, Open Spina Bifida  4. Short interval between pregnancies affecting pregnancy, antepartum   5. History of postpartum hemorrhage, currently pregnant TXA and other interventions at time of delivery  6. History of gestational hypertension Pt to start baby ASA once she picks it up  7. BMI 40.0-44.9, adult (HCC)   Preterm labor symptoms and general obstetric precautions including but not limited to vaginal bleeding, contractions, leaking of fluid and fetal movement were reviewed in detail with the patient.  Please refer to After Visit Summary for other counseling recommendations.   Return in about 4 weeks (around 10/17/2023) for in person, [facility].   Jerilynn Buddle, MD Faculty Attending Center for [facility]

## 2023-09-19 NOTE — Progress Notes (Signed)
 Pt presents for ROB. Was having issues with vomiting, improved/controlled with meds. Need to pick up aspirin .

## 2023-09-21 ENCOUNTER — Ambulatory Visit: Payer: Self-pay | Admitting: Obstetrics and Gynecology

## 2023-09-21 DIAGNOSIS — Z3480 Encounter for supervision of other normal pregnancy, unspecified trimester: Secondary | ICD-10-CM

## 2023-09-21 LAB — AFP, SERUM, OPEN SPINA BIFIDA
AFP MoM: 0.89
AFP Value: 26.6 ng/mL
Gest. Age on Collection Date: 17 wk
Maternal Age At EDD: 26.2 a
OSBR Risk 1 IN: 10000
Test Results:: NEGATIVE
Weight: 263 [lb_av]

## 2023-10-12 ENCOUNTER — Other Ambulatory Visit: Payer: Self-pay | Admitting: *Deleted

## 2023-10-12 ENCOUNTER — Ambulatory Visit: Attending: Obstetrics and Gynecology | Admitting: Obstetrics

## 2023-10-12 ENCOUNTER — Ambulatory Visit (HOSPITAL_BASED_OUTPATIENT_CLINIC_OR_DEPARTMENT_OTHER)

## 2023-10-12 VITALS — BP 125/52 | HR 72

## 2023-10-12 DIAGNOSIS — O99213 Obesity complicating pregnancy, third trimester: Secondary | ICD-10-CM | POA: Diagnosis present

## 2023-10-12 DIAGNOSIS — O365920 Maternal care for other known or suspected poor fetal growth, second trimester, not applicable or unspecified: Secondary | ICD-10-CM

## 2023-10-12 DIAGNOSIS — O99212 Obesity complicating pregnancy, second trimester: Secondary | ICD-10-CM

## 2023-10-12 DIAGNOSIS — Z3689 Encounter for other specified antenatal screening: Secondary | ICD-10-CM | POA: Diagnosis not present

## 2023-10-12 DIAGNOSIS — Z7982 Long term (current) use of aspirin: Secondary | ICD-10-CM | POA: Diagnosis not present

## 2023-10-12 DIAGNOSIS — O09292 Supervision of pregnancy with other poor reproductive or obstetric history, second trimester: Secondary | ICD-10-CM

## 2023-10-12 DIAGNOSIS — O09293 Supervision of pregnancy with other poor reproductive or obstetric history, third trimester: Secondary | ICD-10-CM | POA: Insufficient documentation

## 2023-10-12 DIAGNOSIS — O0990 Supervision of high risk pregnancy, unspecified, unspecified trimester: Secondary | ICD-10-CM

## 2023-10-12 DIAGNOSIS — Z8759 Personal history of other complications of pregnancy, childbirth and the puerperium: Secondary | ICD-10-CM | POA: Diagnosis not present

## 2023-10-12 DIAGNOSIS — Z3A20 20 weeks gestation of pregnancy: Secondary | ICD-10-CM

## 2023-10-12 DIAGNOSIS — E669 Obesity, unspecified: Secondary | ICD-10-CM

## 2023-10-12 DIAGNOSIS — O133 Gestational [pregnancy-induced] hypertension without significant proteinuria, third trimester: Secondary | ICD-10-CM | POA: Diagnosis not present

## 2023-10-12 DIAGNOSIS — O09899 Supervision of other high risk pregnancies, unspecified trimester: Secondary | ICD-10-CM

## 2023-10-12 DIAGNOSIS — O358XX0 Maternal care for other (suspected) fetal abnormality and damage, not applicable or unspecified: Secondary | ICD-10-CM

## 2023-10-12 DIAGNOSIS — Z8742 Personal history of other diseases of the female genital tract: Secondary | ICD-10-CM

## 2023-10-12 NOTE — Progress Notes (Signed)
 MFM Consult Note  Michele Fox is currently at 20 weeks and 4 days.  She was seen due to maternal obesity with a BMI of 41.6.  Her prior pregnancy was complicated by IUGR and gestational hypertension.  She denies any problems in her current pregnancy.  Her blood pressure today was 125/52.  She had a cell free DNA test earlier in her pregnancy which indicated a low risk for trisomy 5, 72, and 13. A female fetus is predicted.   Sonographic findings Single intrauterine pregnancy at 20w 4d  Fetal cardiac activity:  Observed and appears normal. Presentation: Cephalic. The anatomic structures that were well seen appear normal without evidence of soft markers. Due to poor acoustic windows some structures remain suboptimally visualized. Fetal biometry shows the estimated fetal weight at the 52nd percentile.  Amniotic fluid: Within normal limits.  MVP: 4.55 cm. Placenta: Posterior. Adnexa: No adnexal mass visualized. Cervical length: 3 cm.  The views of the fetal anatomy were limited today due to the fetal position and maternal body habitus.  What was visualized today appeared within normal limits.  The patient was informed that anomalies may be missed due to technical limitations. If the fetus is in a suboptimal position or maternal habitus is increased, visualization of the fetus in the maternal uterus may be impaired.  Due to her history of IUGR in her prior pregnancy, we will continue to follow her with growth ultrasounds throughout her pregnancy.    Due to maternal obesity, weekly fetal testing should be started at around 34 weeks.    She was advised to continue taking a daily baby aspirin  for preeclampsia prophylaxis.  She will return in 4 weeks for another growth scan.  The patient stated that all of her questions were answered today.  A total of 30 minutes was spent counseling and coordinating the care for this patient.  Greater than 50% of the time was spent in direct  face-to-face contact.

## 2023-10-16 ENCOUNTER — Other Ambulatory Visit: Payer: Self-pay | Admitting: Obstetrics and Gynecology

## 2023-10-16 DIAGNOSIS — O219 Vomiting of pregnancy, unspecified: Secondary | ICD-10-CM

## 2023-10-16 DIAGNOSIS — O0990 Supervision of high risk pregnancy, unspecified, unspecified trimester: Secondary | ICD-10-CM

## 2023-10-17 ENCOUNTER — Telehealth: Payer: Self-pay

## 2023-10-17 ENCOUNTER — Ambulatory Visit (INDEPENDENT_AMBULATORY_CARE_PROVIDER_SITE_OTHER): Admitting: Obstetrics and Gynecology

## 2023-10-17 VITALS — BP 104/67 | HR 69 | Wt 271.0 lb

## 2023-10-17 DIAGNOSIS — O219 Vomiting of pregnancy, unspecified: Secondary | ICD-10-CM

## 2023-10-17 DIAGNOSIS — O0990 Supervision of high risk pregnancy, unspecified, unspecified trimester: Secondary | ICD-10-CM | POA: Diagnosis not present

## 2023-10-17 DIAGNOSIS — O09899 Supervision of other high risk pregnancies, unspecified trimester: Secondary | ICD-10-CM | POA: Diagnosis not present

## 2023-10-17 DIAGNOSIS — O99212 Obesity complicating pregnancy, second trimester: Secondary | ICD-10-CM

## 2023-10-17 DIAGNOSIS — Z8759 Personal history of other complications of pregnancy, childbirth and the puerperium: Secondary | ICD-10-CM

## 2023-10-17 DIAGNOSIS — Z8742 Personal history of other diseases of the female genital tract: Secondary | ICD-10-CM

## 2023-10-17 DIAGNOSIS — Z6841 Body Mass Index (BMI) 40.0 and over, adult: Secondary | ICD-10-CM

## 2023-10-17 DIAGNOSIS — O09299 Supervision of pregnancy with other poor reproductive or obstetric history, unspecified trimester: Secondary | ICD-10-CM

## 2023-10-17 MED ORDER — PROMETHAZINE HCL 25 MG PO TABS: 25.0000 mg | ORAL_TABLET | Freq: Four times a day (QID) | ORAL | 2 refills | Status: DC | PRN

## 2023-10-17 NOTE — Progress Notes (Signed)
   PRENATAL VISIT NOTE  Subjective:  Michele Fox is a 28 y.o. G3P2002 at [redacted]w[redacted]d being seen today for ongoing prenatal care.  She is currently monitored for the following issues for this high-risk pregnancy and has Supervision of other normal pregnancy, antepartum; Obesity affecting pregnancy; Group B Streptococcus carrier, +RV culture, currently pregnant; Supervision of high risk pregnancy, antepartum; History of postpartum hemorrhage, currently pregnant; BMI 40.0-44.9, adult (HCC); Short interval between pregnancies affecting pregnancy, antepartum; History of gestational hypertension; and History of uterine inversion on their problem list.  Patient doing well with no acute concerns today. She reports nausea.  Contractions: Not present. Vag. Bleeding: None.  Movement: Present. Denies leaking of fluid.   The following portions of the patient's history were reviewed and updated as appropriate: allergies, current medications, past family history, past medical history, past social history, past surgical history and problem list. Problem list updated.  Objective:   Vitals:   10/17/23 1556  BP: 104/67  Pulse: 69  Weight: 271 lb (122.9 kg)    Fetal Status: Fetal Heart Rate (bpm): 160 Fundal Height: 21 cm Movement: Present     General:  Alert, oriented and cooperative. Patient is in no acute distress.  Skin: Skin is warm and dry. No rash noted.   Cardiovascular: Normal heart rate noted  Respiratory: Normal respiratory effort, no problems with respiration noted  Abdomen: Soft, gravid, appropriate for gestational age.  Pain/Pressure: Absent     Pelvic: Cervical exam deferred        Extremities: Normal range of motion.  Edema: Trace  Mental Status:  Normal mood and affect. Normal behavior. Normal judgment and thought content.   Assessment and Plan:  Pregnancy: G3P2002 at 100w2d  1. Nausea and vomiting in pregnancy Refill of phenergan  sent - promethazine  (PHENERGAN ) 25 MG tablet; Take 1  tablet (25 mg total) by mouth every 6 (six) hours as needed for nausea or vomiting.  Dispense: 30 tablet; Refill: 2  2. Supervision of high risk pregnancy, antepartum Continue routine prenatal care Pt will accept flu shot at time of third trimester labs  - promethazine  (PHENERGAN ) 25 MG tablet; Take 1 tablet (25 mg total) by mouth every 6 (six) hours as needed for nausea or vomiting.  Dispense: 30 tablet; Refill: 2  3. History of uterine inversion (Primary) Be aware at time of delivery, TXA and uterotonics available, minimal cord traction  4. Short interval between pregnancies affecting pregnancy, antepartum   5. Obesity affecting pregnancy in second trimester, unspecified obesity type   6. History of postpartum hemorrhage, currently pregnant Uterotonics and TXA at time of delivery  7. History of gestational hypertension Compliant with baby ASA BP normal currently  8. BMI 40.0-44.9, adult (HCC)   Preterm labor symptoms and general obstetric precautions including but not limited to vaginal bleeding, contractions, leaking of fluid and fetal movement were reviewed in detail with the patient.  Please refer to After Visit Summary for other counseling recommendations.   Return in about 4 weeks (around 11/14/2023) for [facility], in person.   Jerilynn Buddle, MD Faculty Attending Center for [facility]

## 2023-11-09 ENCOUNTER — Ambulatory Visit

## 2023-11-14 ENCOUNTER — Encounter: Admitting: Physician Assistant

## 2023-11-15 ENCOUNTER — Telehealth: Payer: Self-pay

## 2023-11-16 ENCOUNTER — Ambulatory Visit: Attending: Maternal & Fetal Medicine | Admitting: Maternal & Fetal Medicine

## 2023-11-16 ENCOUNTER — Ambulatory Visit (HOSPITAL_BASED_OUTPATIENT_CLINIC_OR_DEPARTMENT_OTHER)

## 2023-11-16 ENCOUNTER — Other Ambulatory Visit: Payer: Self-pay | Admitting: *Deleted

## 2023-11-16 VITALS — BP 128/58 | HR 70

## 2023-11-16 DIAGNOSIS — O09292 Supervision of pregnancy with other poor reproductive or obstetric history, second trimester: Secondary | ICD-10-CM | POA: Diagnosis not present

## 2023-11-16 DIAGNOSIS — O99212 Obesity complicating pregnancy, second trimester: Secondary | ICD-10-CM | POA: Insufficient documentation

## 2023-11-16 DIAGNOSIS — Z3A25 25 weeks gestation of pregnancy: Secondary | ICD-10-CM | POA: Insufficient documentation

## 2023-11-16 DIAGNOSIS — E669 Obesity, unspecified: Secondary | ICD-10-CM | POA: Diagnosis not present

## 2023-11-16 DIAGNOSIS — O0990 Supervision of high risk pregnancy, unspecified, unspecified trimester: Secondary | ICD-10-CM

## 2023-11-16 DIAGNOSIS — Z362 Encounter for other antenatal screening follow-up: Secondary | ICD-10-CM | POA: Diagnosis present

## 2023-11-16 DIAGNOSIS — O09892 Supervision of other high risk pregnancies, second trimester: Secondary | ICD-10-CM | POA: Diagnosis not present

## 2023-11-16 DIAGNOSIS — O43199 Other malformation of placenta, unspecified trimester: Secondary | ICD-10-CM

## 2023-11-16 DIAGNOSIS — O358XX0 Maternal care for other (suspected) fetal abnormality and damage, not applicable or unspecified: Secondary | ICD-10-CM

## 2023-11-16 DIAGNOSIS — Z8742 Personal history of other diseases of the female genital tract: Secondary | ICD-10-CM

## 2023-11-16 DIAGNOSIS — Z8759 Personal history of other complications of pregnancy, childbirth and the puerperium: Secondary | ICD-10-CM

## 2023-11-16 NOTE — Progress Notes (Signed)
 After review, MFM consult with provider is not indicated for today  Michele Nathanel Pipe, MD 11/16/2023 3:36 PM  [facility]

## 2023-11-29 ENCOUNTER — Ambulatory Visit: Admitting: Obstetrics

## 2023-11-29 ENCOUNTER — Encounter: Payer: Self-pay | Admitting: Obstetrics

## 2023-11-29 VITALS — BP 115/69 | HR 72 | Wt 274.7 lb

## 2023-11-29 DIAGNOSIS — O09299 Supervision of pregnancy with other poor reproductive or obstetric history, unspecified trimester: Secondary | ICD-10-CM

## 2023-11-29 DIAGNOSIS — O0990 Supervision of high risk pregnancy, unspecified, unspecified trimester: Secondary | ICD-10-CM

## 2023-11-29 DIAGNOSIS — Z8759 Personal history of other complications of pregnancy, childbirth and the puerperium: Secondary | ICD-10-CM | POA: Diagnosis not present

## 2023-11-29 DIAGNOSIS — Z8742 Personal history of other diseases of the female genital tract: Secondary | ICD-10-CM | POA: Diagnosis not present

## 2023-11-29 DIAGNOSIS — O219 Vomiting of pregnancy, unspecified: Secondary | ICD-10-CM

## 2023-11-29 DIAGNOSIS — O99210 Obesity complicating pregnancy, unspecified trimester: Secondary | ICD-10-CM

## 2023-11-29 DIAGNOSIS — O09899 Supervision of other high risk pregnancies, unspecified trimester: Secondary | ICD-10-CM | POA: Diagnosis not present

## 2023-11-29 NOTE — Progress Notes (Signed)
 Pt presents for rob. Pt has no questions or concerns at this time.

## 2023-11-29 NOTE — Progress Notes (Signed)
 Subjective:  Michele Fox is a 28 y.o. G3P2002 at [redacted]w[redacted]d being seen today for ongoing prenatal care.  She is currently monitored for the following issues for this high-risk pregnancy and has Supervision of other normal pregnancy, antepartum; Obesity affecting pregnancy; Group B Streptococcus carrier, +RV culture, currently pregnant; Supervision of high risk pregnancy, antepartum; History of postpartum hemorrhage, currently pregnant; BMI 40.0-44.9, adult (HCC); Short interval between pregnancies affecting pregnancy, antepartum; History of gestational hypertension; and History of uterine inversion on their problem list.  Patient reports nausea.  Contractions: Not present. Vag. Bleeding: None.  Movement: Present. Denies leaking of fluid.   The following portions of the patient's history were reviewed and updated as appropriate: allergies, current medications, past family history, past medical history, past social history, past surgical history and problem list. Problem list updated.  Objective:   Vitals:   11/29/23 0941  BP: 115/69  Pulse: 72  Weight: 274 lb 11.2 oz (124.6 kg)    Fetal Status: Fetal Heart Rate (bpm): 139   Movement: Present     General:  Alert, oriented and cooperative. Patient is in no acute distress.  Skin: Skin is warm and dry. No rash noted.   Cardiovascular: Normal heart rate noted  Respiratory: Normal respiratory effort, no problems with respiration noted  Abdomen: Soft, gravid, appropriate for gestational age. Pain/Pressure: Absent     Pelvic:  Cervical exam deferred        Extremities: Normal range of motion.  Edema: Trace  Mental Status: Normal mood and affect. Normal behavior. Normal judgment and thought content.   Urinalysis:      Assessment and Plan:  Pregnancy: G3P2002 at [redacted]w[redacted]d  1. Supervision of high risk pregnancy, antepartum (Primary)  2. Short interval between pregnancies affecting pregnancy, antepartum  3. History of gestational hypertension -  BP clinically stable  4. History of uterine inversion  5. History of postpartum hemorrhage, currently pregnant  6. Obesity affecting pregnancy, antepartum, unspecified obesity type   Preterm labor symptoms and general obstetric precautions including but not limited to vaginal bleeding, contractions, leaking of fluid and fetal movement were reviewed in detail with the patient. Please refer to After Visit Summary for other counseling recommendations.   Return in about 2 weeks (around 12/13/2023) for [facility].   Rudy Carlin LABOR, MD 11/29/2023

## 2023-11-30 ENCOUNTER — Other Ambulatory Visit: Payer: Self-pay

## 2023-11-30 DIAGNOSIS — O219 Vomiting of pregnancy, unspecified: Secondary | ICD-10-CM

## 2023-11-30 DIAGNOSIS — O0990 Supervision of high risk pregnancy, unspecified, unspecified trimester: Secondary | ICD-10-CM

## 2023-11-30 MED ORDER — PROMETHAZINE HCL 25 MG PO TABS: 25.0000 mg | ORAL_TABLET | Freq: Four times a day (QID) | ORAL | 2 refills | Status: AC | PRN

## 2023-12-14 ENCOUNTER — Encounter: Payer: Self-pay | Admitting: Obstetrics

## 2023-12-14 ENCOUNTER — Other Ambulatory Visit

## 2023-12-27 ENCOUNTER — Ambulatory Visit

## 2023-12-31 ENCOUNTER — Ambulatory Visit: Payer: Self-pay | Admitting: Family Medicine

## 2023-12-31 ENCOUNTER — Other Ambulatory Visit

## 2023-12-31 ENCOUNTER — Encounter: Payer: Self-pay | Admitting: Family Medicine

## 2023-12-31 VITALS — BP 126/79 | HR 70 | Wt 268.2 lb

## 2023-12-31 DIAGNOSIS — Z2911 Encounter for prophylactic immunotherapy for respiratory syncytial virus (RSV): Secondary | ICD-10-CM | POA: Diagnosis not present

## 2023-12-31 DIAGNOSIS — Z23 Encounter for immunization: Secondary | ICD-10-CM | POA: Diagnosis not present

## 2023-12-31 DIAGNOSIS — Z8742 Personal history of other diseases of the female genital tract: Secondary | ICD-10-CM | POA: Diagnosis not present

## 2023-12-31 DIAGNOSIS — O0993 Supervision of high risk pregnancy, unspecified, third trimester: Secondary | ICD-10-CM

## 2023-12-31 DIAGNOSIS — F411 Generalized anxiety disorder: Secondary | ICD-10-CM | POA: Insufficient documentation

## 2023-12-31 DIAGNOSIS — O0990 Supervision of high risk pregnancy, unspecified, unspecified trimester: Secondary | ICD-10-CM

## 2023-12-31 DIAGNOSIS — Z3A32 32 weeks gestation of pregnancy: Secondary | ICD-10-CM | POA: Diagnosis not present

## 2023-12-31 MED ORDER — SERTRALINE HCL 50 MG PO TABS: 50.0000 mg | ORAL_TABLET | Freq: Every day | ORAL | 3 refills | Status: AC

## 2023-12-31 NOTE — Progress Notes (Signed)
 PRENATAL VISIT NOTE  Subjective:  Michele Fox is a 28 y.o. G3P2002 at [redacted]w[redacted]d being seen today for ongoing prenatal care.  She is currently monitored for the following issues for this low-risk pregnancy and has Obesity affecting pregnancy; Supervision of high risk pregnancy, antepartum; History of postpartum hemorrhage, currently pregnant; BMI 40.0-44.9, adult (HCC); Short interval between pregnancies affecting pregnancy, antepartum; History of gestational hypertension; History of uterine inversion; and Generalized anxiety disorder on their problem list.  Patient reports anxiety.  Contractions: Not present. Vag. Bleeding: None.  Movement: Present. Denies leaking of fluid.   The following portions of the patient's history were reviewed and updated as appropriate: allergies, current medications, past family history, past medical history, past social history, past surgical history and problem list.   Objective:   Vitals:   12/31/23 0955  BP: 126/79  Pulse: 70  Weight: 268 lb 3.2 oz (121.7 kg)    Fetal Status:  Fetal Heart Rate (bpm): 139 Fundal Height: 32 cm Movement: Present    General: Alert, oriented and cooperative. Patient is in no acute distress.  Skin: Skin is warm and dry. No rash noted.   Cardiovascular: Normal heart rate noted  Respiratory: Normal respiratory effort, no problems with respiration noted  Abdomen: Soft, gravid, appropriate for gestational age.  Pain/Pressure: Present     Pelvic: Cervical exam deferred        Extremities: Normal range of motion.  Edema: None  Mental Status: Normal mood and affect. Normal behavior. Normal judgment and thought content.      12/31/2023   10:01 AM 08/22/2023    9:57 AM 08/15/2023    8:50 AM  Depression screen PHQ 2/9  Decreased Interest 2 1 0  Down, Depressed, Hopeless 0 0 0  PHQ - 2 Score 2 1 0  Altered sleeping 3 3 0  Tired, decreased energy 3 3 0  Change in appetite 3 3 0  Feeling bad or failure about yourself  1 1 1    Trouble concentrating 0 0 0  Moving slowly or fidgety/restless 1 0 0  Suicidal thoughts 0 0 0  PHQ-9 Score 13 11  1    Difficult doing work/chores  Not difficult at all      Data saved with a previous flowsheet row definition        12/31/2023   10:02 AM 08/22/2023   10:10 AM 08/15/2023    8:51 AM 04/27/2022    4:26 PM  GAD 7 : Generalized Anxiety Score  Nervous, Anxious, on Edge 1 1 1  0  Control/stop worrying 1 0 3 0  Worry too much - different things 1 2 3  0  Trouble relaxing 1 2 0 1  Restless 3 1 1  0  Easily annoyed or irritable 3 0 2 0  Afraid - awful might happen 1 0 0 0  Total GAD 7 Score 11 6 10 1   Anxiety Difficulty  Not difficult at all  Not difficult at all    Assessment and Plan:  Pregnancy: G3P2002 at [redacted]w[redacted]d 1. Supervision of high risk pregnancy, antepartum (Primary) 28 week labs on Friday (pt. Was late to appointment today) - Flu vaccine trivalent PF, 6mos and older(Flulaval,Afluria,Fluarix,Fluzone) - Tdap vaccine greater than or equal to 7yo IM - Ambulatory referral to Integrated Behavioral [facility]  2. History of uterine inversion Discussed risks of recurrence  3. Generalized anxiety disorder Discussed treatment options. Usual timing of onset of action, side effects, need for dosing adjustments reviewed. If it makes you sleepy, take  in the pm. If keeps you up, try taking in the am. Will be helpful prior to pp state. - sertraline  (ZOLOFT ) 50 MG tablet; Take 1 tablet (50 mg total) by mouth daily. Take 1/2 tab x 5-7 days, then take whole tablet  Dispense: 30 tablet; Refill: 3  4. [redacted] weeks gestation of pregnancy   Preterm labor symptoms and general obstetric precautions including but not limited to vaginal bleeding, contractions, leaking of fluid and fetal movement were reviewed in detail with the patient. Please refer to After Visit Summary for other counseling recommendations.   Return in 2 weeks (on 01/14/2024) for CNM visit please.  Future Appointments  Date  Time Provider Department Center  01/04/2024  9:00 AM [facility] None  01/14/2024  3:15 PM [facility] PROVIDER 1 [facility]  01/14/2024  3:30 PM [facility] US3 [facility]  01/29/2024  2:15 PM [facility] PROVIDER 1 [facility]  01/29/2024  2:30 PM [facility] US1 [facility]  02/05/2024  2:15 PM [facility] PROVIDER 1 [facility]  02/05/2024  2:30 PM [facility] US2 [facility]    Glenys GORMAN Birk, MD

## 2023-12-31 NOTE — Patient Instructions (Signed)
   Considering Waterbirth? Guide for patients at Center for [facility]) Why consider waterbirth? Gentle birth for babies  Less pain medicine used in labor  May allow for passive descent/less pushing  May reduce perineal tears  More mobility and instinctive maternal position changes  Increased maternal relaxation   Is waterbirth safe? What are the risks of infection, drowning or other complications? Infection:  Very low risk (3.7 % for tub vs 4.8% for bed)  7 in 8000 waterbirths with documented infection  Poorly cleaned equipment most common cause  Slightly lower group B strep transmission rate  Drowning  Maternal:  Very low risk  Related to seizures or fainting  Newborn:  Very low risk. No evidence of increased risk of respiratory problems in multiple large studies  Physiological protection from breathing under water  Avoid underwater birth if there are any fetal complications  Once baby's head is out of the water, keep it out.  Birth complication  Some reports of cord trauma, but risk decreased by bringing baby to surface gradually  No evidence of increased risk of shoulder dystocia. Mothers can usually change positions faster in water than in a bed, possibly aiding the maneuvers to free the shoulder.   There are 2 things you MUST do to have a waterbirth with [facility]: Attend a waterbirth class at [facility]   3rd Wednesday of every month from 7-9 pm (virtual during COVID) [employer] at [email or web address] or [email or web address] or by calling [phone] Bring us  the certificate from the class to your prenatal appointment or send via MyChart Meet with a midwife at 36 weeks* to see if you can still plan a waterbirth and to sign the consent.   *We also recommend that you schedule as many of your prenatal visits with a midwife as possible.    Helpful information: You may want to bring a bathing suit top to the hospital  to wear during labor but this is optional.  All other supplies are provided by the hospital. Please arrive at the hospital with signs of active labor, and do not wait at home until late in labor. It takes 45 min- 1 hour for fetal monitoring, and check in to your room to take place, plus transport and filling of the waterbirth tub.    Things that would prevent you from having a waterbirth: Premature, <37wks  Previous cesarean birth  Presence of thick meconium-stained fluid  Multiple gestation (Twins, triplets, etc.)  Uncontrolled diabetes or gestational diabetes requiring medication  Hypertension diagnosed in pregnancy or preexisting hypertension (gestational hypertension, preeclampsia, or chronic hypertension) Fetal growth restriction (your baby measures less than 10th percentile on ultrasound) Heavy vaginal bleeding  Non-reassuring fetal heart rate  Active infection (MRSA, etc.). Group B Strep is NOT a contraindication for waterbirth.  If your labor has to be induced and induction method requires continuous monitoring of the baby's heart rate  Other risks/issues identified by your obstetrical provider   Please remember that birth is unpredictable. Under certain unforeseeable circumstances your provider may advise against giving birth in the tub. These decisions will be made on a case-by-case basis and with the safety of you and your baby as our highest priority.    Updated 04/27/21

## 2023-12-31 NOTE — Progress Notes (Signed)
 Pt presents for ROB visit. Schedule GTT for this Friday. Had gap in care. Requesting flu, Tdap and RSV vaccines today.   Elevated PHQ9 and GAD7

## 2024-01-04 ENCOUNTER — Other Ambulatory Visit

## 2024-01-04 DIAGNOSIS — O0990 Supervision of high risk pregnancy, unspecified, unspecified trimester: Secondary | ICD-10-CM

## 2024-01-05 LAB — CBC
Hematocrit: 29.5 % — ABNORMAL LOW (ref 34.0–46.6)
Hemoglobin: 9.8 g/dL — ABNORMAL LOW (ref 11.1–15.9)
MCH: 31.7 pg (ref 26.6–33.0)
MCHC: 33.2 g/dL (ref 31.5–35.7)
MCV: 96 fL (ref 79–97)
Platelets: 190 x10E3/uL (ref 150–450)
RBC: 3.09 x10E6/uL — ABNORMAL LOW (ref 3.77–5.28)
RDW: 12 % (ref 11.7–15.4)
WBC: 5 x10E3/uL (ref 3.4–10.8)

## 2024-01-05 LAB — SYPHILIS: RPR W/REFLEX TO RPR TITER AND TREPONEMAL ANTIBODIES, TRADITIONAL SCREENING AND DIAGNOSIS ALGORITHM: RPR Ser Ql: NONREACTIVE

## 2024-01-05 LAB — GLUCOSE TOLERANCE, 2 HOURS W/ 1HR
Glucose, 1 hour: 101 mg/dL (ref 70–179)
Glucose, 2 hour: 74 mg/dL (ref 70–152)
Glucose, Fasting: 72 mg/dL (ref 70–91)

## 2024-01-05 LAB — HIV ANTIBODY (ROUTINE TESTING W REFLEX): HIV Screen 4th Generation wRfx: NONREACTIVE

## 2024-01-07 ENCOUNTER — Encounter: Payer: Self-pay | Admitting: Licensed Clinical Social Worker

## 2024-01-08 ENCOUNTER — Ambulatory Visit: Payer: Self-pay | Admitting: Family Medicine

## 2024-01-08 DIAGNOSIS — O0990 Supervision of high risk pregnancy, unspecified, unspecified trimester: Secondary | ICD-10-CM

## 2024-01-08 DIAGNOSIS — D508 Other iron deficiency anemias: Secondary | ICD-10-CM

## 2024-01-08 MED ORDER — FERROUS SULFATE 325 (65 FE) MG PO TABS: 325.0000 mg | ORAL_TABLET | ORAL | 3 refills | Status: DC

## 2024-01-14 ENCOUNTER — Encounter: Payer: Self-pay | Admitting: Licensed Clinical Social Worker

## 2024-01-14 ENCOUNTER — Ambulatory Visit: Attending: Obstetrics and Gynecology | Admitting: Obstetrics

## 2024-01-14 ENCOUNTER — Other Ambulatory Visit: Payer: Self-pay | Admitting: *Deleted

## 2024-01-14 ENCOUNTER — Ambulatory Visit

## 2024-01-14 VITALS — BP 121/61 | HR 85

## 2024-01-14 DIAGNOSIS — O99213 Obesity complicating pregnancy, third trimester: Secondary | ICD-10-CM | POA: Diagnosis not present

## 2024-01-14 DIAGNOSIS — O09293 Supervision of pregnancy with other poor reproductive or obstetric history, third trimester: Secondary | ICD-10-CM

## 2024-01-14 DIAGNOSIS — Z3A34 34 weeks gestation of pregnancy: Secondary | ICD-10-CM

## 2024-01-14 DIAGNOSIS — O0990 Supervision of high risk pregnancy, unspecified, unspecified trimester: Secondary | ICD-10-CM

## 2024-01-14 DIAGNOSIS — Z3689 Encounter for other specified antenatal screening: Secondary | ICD-10-CM | POA: Diagnosis not present

## 2024-01-14 DIAGNOSIS — O43193 Other malformation of placenta, third trimester: Secondary | ICD-10-CM

## 2024-01-14 DIAGNOSIS — O43199 Other malformation of placenta, unspecified trimester: Secondary | ICD-10-CM

## 2024-01-14 DIAGNOSIS — O99210 Obesity complicating pregnancy, unspecified trimester: Secondary | ICD-10-CM

## 2024-01-14 DIAGNOSIS — E669 Obesity, unspecified: Secondary | ICD-10-CM

## 2024-01-14 DIAGNOSIS — O99212 Obesity complicating pregnancy, second trimester: Secondary | ICD-10-CM

## 2024-01-14 DIAGNOSIS — O43192 Other malformation of placenta, second trimester: Secondary | ICD-10-CM | POA: Insufficient documentation

## 2024-01-14 NOTE — Progress Notes (Signed)
 MFM Consult Note  Michele Fox is currently at [redacted]w[redacted]d. She has been followed due to maternal obesity with a BMI of 41.6.    She denies any problems since her last exam and has screened negative for gestational diabetes in her current pregnancy.    Her blood pressure today was 121/61.  Sonographic findings Single intrauterine pregnancy at 34w 0d. Fetal cardiac activity: Observed. Presentation: Cephalic. Fetal biometry shows the estimated fetal weight of 4 lb 13 oz,  2189g (27%). Amniotic fluid: Within normal limits. AFI: 15.56 cm.  MVP: 6.24 cm. Placenta: Posterior. BPP: 8/8.   Due to maternal obesity, she should continue to be followed with weekly fetal testing until delivery.    Delivery should occur at around 39 weeks.    She will return in 1 week for an NST.  The patient stated that all of her questions were answered.   A total of 20 minutes was spent counseling and coordinating the care for this patient.  Greater than 50% of the time was spent in direct face-to-face contact.

## 2024-01-15 ENCOUNTER — Encounter: Payer: Self-pay | Admitting: Obstetrics

## 2024-01-24 NOTE — L&D Delivery Note (Signed)
 OB/[facility]N [facility] Delivery Note  Michele Fox is a 29 y.o. G3P2002 s/p SVD at [redacted]w[redacted]d. She was admitted for IOL for FGR.   ROM: 3h 13m with clear fluid GBS Status: PRESUMPTIVE NEGATIVE/-- (01/25 2153)    Labor Progress: Initial SVE: 1/30/-4. She then progressed to complete.   Delivery Date/Time: 02/18/24 at 1304 Delivery: Called to room and patient was complete and pushing. Head delivered LOA. No nuchal cord present. Shoulder and body delivered in usual fashion. Infant with spontaneous cry, placed on mother's abdomen, dried and stimulated. Cord clamped x 2 after 1-minute delay, and cut by FOB.Placenta delivered spontaneously with gentle cord traction.   Bilateral periurethral lacerations noted to be hemostatic. Fundus firm with massage but then quickly atonic. TXA and methergine  given initially. EBL at this time 200. Continued to trickle. Uterine sweep performed with multiple small clots removed. Uterus firmed up initially but again atony returned. 800mcg cytotec  given rectally. At this point EBL around in a patient with a history of PPH so decision made for JADA placement. JADA placed per manufacturers instructions and set to suction. Will keep in place for at least 1 hour and reassess. 2g Ancef  given due to uterine sweep.   Baby Weight: pending  Placenta: Sent to L&D Complications: PPH w/ JADA placement.  Lacerations: Bilateral periurethral, hemostatic.  EBL: 958 mL Analgesia: Epidural   Infant:  APGAR (1 MIN): 8  APGAR (5 MINS): 9

## 2024-01-29 ENCOUNTER — Ambulatory Visit

## 2024-01-29 ENCOUNTER — Other Ambulatory Visit

## 2024-02-05 ENCOUNTER — Ambulatory Visit

## 2024-02-05 ENCOUNTER — Other Ambulatory Visit

## 2024-02-11 ENCOUNTER — Other Ambulatory Visit

## 2024-02-11 ENCOUNTER — Ambulatory Visit

## 2024-02-13 ENCOUNTER — Ambulatory Visit: Attending: Maternal & Fetal Medicine | Admitting: Maternal & Fetal Medicine

## 2024-02-13 ENCOUNTER — Ambulatory Visit

## 2024-02-13 ENCOUNTER — Other Ambulatory Visit: Payer: Self-pay | Admitting: Obstetrics

## 2024-02-13 VITALS — BP 116/62 | HR 70

## 2024-02-13 DIAGNOSIS — O09293 Supervision of pregnancy with other poor reproductive or obstetric history, third trimester: Secondary | ICD-10-CM

## 2024-02-13 DIAGNOSIS — O0990 Supervision of high risk pregnancy, unspecified, unspecified trimester: Secondary | ICD-10-CM | POA: Diagnosis not present

## 2024-02-13 DIAGNOSIS — O43193 Other malformation of placenta, third trimester: Secondary | ICD-10-CM

## 2024-02-13 DIAGNOSIS — O365930 Maternal care for other known or suspected poor fetal growth, third trimester, not applicable or unspecified: Secondary | ICD-10-CM | POA: Diagnosis not present

## 2024-02-13 DIAGNOSIS — Z3A38 38 weeks gestation of pregnancy: Secondary | ICD-10-CM

## 2024-02-13 DIAGNOSIS — O0993 Supervision of high risk pregnancy, unspecified, third trimester: Secondary | ICD-10-CM | POA: Diagnosis not present

## 2024-02-13 DIAGNOSIS — O99213 Obesity complicating pregnancy, third trimester: Secondary | ICD-10-CM

## 2024-02-13 DIAGNOSIS — E669 Obesity, unspecified: Secondary | ICD-10-CM

## 2024-02-13 DIAGNOSIS — O09893 Supervision of other high risk pregnancies, third trimester: Secondary | ICD-10-CM | POA: Insufficient documentation

## 2024-02-13 DIAGNOSIS — Z362 Encounter for other antenatal screening follow-up: Secondary | ICD-10-CM | POA: Insufficient documentation

## 2024-02-13 NOTE — Progress Notes (Signed)
 "  Patient information  Patient Name: Michele Fox  Patient MRN:   [id number]  Referring practice: MFM Referring Provider: [place] [facility]  Problem List   Patient Active Problem List   Diagnosis Date Noted   Generalized anxiety disorder 12/31/2023   History of postpartum hemorrhage, currently pregnant 08/22/2023   BMI 40.0-44.9, adult (HCC) 08/22/2023   Short interval between pregnancies affecting pregnancy, antepartum 08/22/2023   History of gestational hypertension 08/22/2023   History of uterine inversion 08/22/2023   Supervision of high risk pregnancy, antepartum 08/15/2023   Obesity affecting pregnancy 06/12/2022    Maternal Fetal medicine Consult  Michele Fox is a 29 y.o. G3P2002 at [redacted]w[redacted]d here for ultrasound and consultation. Michele Fox is doing well today with no acute concerns. Today we focused on the following:   The patient presents today for a growth ultrasound. Todays evaluation demonstrates fetal growth restriction, with an estimated fetal weight at the 14th percentile overall and an abdominal circumference at the 8th percentile. Umbilical artery Doppler studies are normal, and the biophysical profile is reassuring at 8/8.  We discussed that these findings are consistent with mild fetal growth restriction. We reviewed potential etiologies, including a constitutionally small fetus, marginal umbilical cord insertion (which this patient has), or a combination of factors. We also discussed the possibility of placental insufficiency as a contributing factor; however, this is less likely at present given the normal umbilical artery Dopplers, though it cannot be entirely excluded.  We discussed that these findings are consistent with mild fetal growth restriction. Given the gestational age and reassuring antenatal testing, delivery is recommended within the next 5 to 7 days. We reviewed that this timing balances the increased risk of stillbirth and other  complications associated with fetal growth restriction in the late-term period against the risks of early-term neonatal complications.  The patient reports good fetal movement today. She verbalizes understanding of the recommendations and agrees with the plan. She was encouraged to schedule a follow-up appointment with her primary OB provider as soon as possible to coordinate delivery planning.  Sonographic findings Single intrauterine pregnancy at 38w 2d. Fetal cardiac activity:  Observed and appears normal. Presentation: Cephalic. Interval fetal anatomy appears normal. Fetal biometry shows the estimated fetal weight of 6 lb 4 oz, 2840 grams (14%) and the abdominal circumference at the 8th percentile.  Amniotic fluid: Within normal limits. AFI: 17.97cm.   MVP: 5.88 cm. Placenta: Posterior. Umbilical artery dopplers findings: -S/D:2.37 which are normal at this gestational age.  -Absent end-diastolic flow: No.  -Reversed end-diastolic flow:  No. BPP 8/8.   Recommendations -Recommend delivery within the next 5-7 days (38-[redacted]w[redacted]d) -Continue daily fetal movement monitoring -Follow up with primary OB provider promptly to arrange delivery -Return for evaluation with any decrease in fetal movement or other concerns  The patient had time to ask questions that were answered to her satisfaction.  She verbalized understanding and agrees to proceed with the plan above.   I spent 30 minutes reviewing the patients chart, including labs and images as well as counseling the patient about her medical conditions. Greater than 50% of the time was spent in direct face-to-face patient counseling.  Michele Fox  MFM, [place]   02/13/2024  3:48 PM   Review of Systems: A review of systems was performed and was negative except per HPI   Vitals and Physical Exam    02/13/2024    3:20 PM 01/14/2024    3:21 PM 12/31/2023    9:55 AM  Vitals with BMI  Weight   268 lbs 3 oz  Systolic 116 121 873   Diastolic 62 61 79  Pulse 70 85 70   Sitting comfortably on the sonogram table Nonlabored breathing Normal rate and rhythm Abdomen is nontender  Past pregnancies OB History  Gravida Para Term Preterm AB Living  3 2 2  0 0 2  SAB IAB Ectopic Multiple Live Births  0 0 0 0 2    # Outcome Date GA Lbr Len/2nd Weight Sex Type Anes PTL Lv  3 Current           2 Term 10/20/22 [redacted]w[redacted]d / 00:42 5 lb (2.267 kg) F Vag-Spont EPI  LIV     Birth Comments: WDL  1 Term 08/12/18 [redacted]w[redacted]d 11:45 / 00:29 6 lb 5.4 oz (2.875 kg) M Vag-Spont EPI  LIV     No future appointments.    "

## 2024-02-14 ENCOUNTER — Ambulatory Visit: Admitting: Family Medicine

## 2024-02-14 ENCOUNTER — Other Ambulatory Visit (HOSPITAL_COMMUNITY)
Admission: RE | Admit: 2024-02-14 | Discharge: 2024-02-14 | Disposition: A | Source: Ambulatory Visit | Attending: Family Medicine | Admitting: Family Medicine

## 2024-02-14 ENCOUNTER — Encounter: Payer: Self-pay | Admitting: Family Medicine

## 2024-02-14 VITALS — BP 115/73 | HR 71 | Wt 264.6 lb

## 2024-02-14 DIAGNOSIS — Z3483 Encounter for supervision of other normal pregnancy, third trimester: Secondary | ICD-10-CM | POA: Insufficient documentation

## 2024-02-14 DIAGNOSIS — O09893 Supervision of other high risk pregnancies, third trimester: Secondary | ICD-10-CM

## 2024-02-14 DIAGNOSIS — B3731 Acute candidiasis of vulva and vagina: Secondary | ICD-10-CM

## 2024-02-14 DIAGNOSIS — Z3A38 38 weeks gestation of pregnancy: Secondary | ICD-10-CM

## 2024-02-14 DIAGNOSIS — O23593 Infection of other part of genital tract in pregnancy, third trimester: Secondary | ICD-10-CM

## 2024-02-14 DIAGNOSIS — O0993 Supervision of high risk pregnancy, unspecified, third trimester: Secondary | ICD-10-CM | POA: Diagnosis not present

## 2024-02-14 DIAGNOSIS — O365930 Maternal care for other known or suspected poor fetal growth, third trimester, not applicable or unspecified: Secondary | ICD-10-CM | POA: Diagnosis not present

## 2024-02-14 DIAGNOSIS — O0990 Supervision of high risk pregnancy, unspecified, unspecified trimester: Secondary | ICD-10-CM

## 2024-02-14 DIAGNOSIS — O99213 Obesity complicating pregnancy, third trimester: Secondary | ICD-10-CM | POA: Diagnosis not present

## 2024-02-14 DIAGNOSIS — O23599 Infection of other part of genital tract in pregnancy, unspecified trimester: Secondary | ICD-10-CM

## 2024-02-14 DIAGNOSIS — O09899 Supervision of other high risk pregnancies, unspecified trimester: Secondary | ICD-10-CM

## 2024-02-14 DIAGNOSIS — O99212 Obesity complicating pregnancy, second trimester: Secondary | ICD-10-CM

## 2024-02-14 MED ORDER — FLUCONAZOLE 150 MG PO TABS: 150.0000 mg | ORAL_TABLET | Freq: Once | ORAL | 0 refills | Status: AC

## 2024-02-14 NOTE — Progress Notes (Signed)
 Pt reports she was positive for Group B strep last pregnancy.   Current symptoms of white discharge, itching, and swelling of the outer labia.   36 week swabs today.  Note from MFM on 02/13/24 recommends delivery between 38-39 weeks.

## 2024-02-14 NOTE — Progress Notes (Signed)
 "  PRENATAL VISIT NOTE  Subjective:  Michele Fox is a 29 y.o. G3P2002 at [redacted]w[redacted]d being seen today for ongoing prenatal care.  She is currently monitored for the following issues for this high-risk pregnancy and has Obesity affecting pregnancy; Supervision of high risk pregnancy, antepartum; History of postpartum hemorrhage, currently pregnant; BMI 40.0-44.9, adult (HCC); Short interval between pregnancies affecting pregnancy, antepartum; History of gestational hypertension; History of uterine inversion; Generalized anxiety disorder; and Poor fetal growth affecting management of mother in third trimester on their problem list.  Patient reports white discharge, itching, and swelling of the outer labia.  Contractions: Irritability. Vag. Bleeding: None.  Movement: Present. Denies leaking of fluid.   The following portions of the patient's history were reviewed and updated as appropriate: allergies, current medications, past family history, past medical history, past social history, past surgical history and problem list.   Objective:   Vitals:   02/14/24 1355  BP: 115/73  Pulse: 71  Weight: 264 lb 9.6 oz (120 kg)    Fetal Status:  Fetal Heart Rate (bpm): 131   Movement: Present    General: Alert, oriented and cooperative. Patient is in no acute distress.  Skin: Skin is warm and dry. No rash noted.   Cardiovascular: Normal heart rate noted  Respiratory: Normal respiratory effort, no problems with respiration noted  Abdomen: Soft, gravid, appropriate for gestational age.  Pain/Pressure: Present     Pelvic: Cervical exam performed in the presence of a chaperone Dilation: Fingertip Effacement (%): 0 Station: -4 (Ballotable)  Extremities: Normal range of motion.  Edema: None  Mental Status: Normal mood and affect. Normal behavior. Normal judgment and thought content.      12/31/2023   10:01 AM 08/22/2023    9:57 AM 08/15/2023    8:50 AM  Depression screen PHQ 2/9  Decreased Interest 2 1 0   Down, Depressed, Hopeless 0 0 0  PHQ - 2 Score 2 1 0  Altered sleeping 3 3 0  Tired, decreased energy 3 3 0  Change in appetite 3 3 0  Feeling bad or failure about yourself  1 1 1   Trouble concentrating 0 0 0  Moving slowly or fidgety/restless 1 0 0  Suicidal thoughts 0 0 0  PHQ-9 Score 13 11  1    Difficult doing work/chores  Not difficult at all      Data saved with a previous flowsheet row definition        12/31/2023   10:02 AM 08/22/2023   10:10 AM 08/15/2023    8:51 AM 04/27/2022    4:26 PM  GAD 7 : Generalized Anxiety Score  Nervous, Anxious, on Edge 1  1  1   0   Control/stop worrying 1  0  3  0   Worry too much - different things 1  2  3   0   Trouble relaxing 1  2  0  1   Restless 3  1  1   0   Easily annoyed or irritable 3  0  2  0   Afraid - awful might happen 1  0  0  0   Total GAD 7 Score 11 6 10 1   Anxiety Difficulty  Not difficult at all  Not difficult at all     Data saved with a previous flowsheet row definition    Assessment and Plan:  Pregnancy: G3P2002 at [redacted]w[redacted]d 1. Supervision of high risk pregnancy, antepartum (Primary) Prenatal course reviewed BP, HR, FHR within normal limits  Feeling regular FM   2. Short interval between pregnancies affecting pregnancy, antepartum  3. Obesity affecting pregnancy in second trimester, unspecified obesity type Continue aspirin  81 mg daily   4. Poor fetal growth affecting management of mother in third trimester, single or unspecified fetus Deliver between 1/26 and 1/28 per MFM. IOL scheduled for midnight on 02/18/2023.   5. [redacted] weeks gestation of pregnancy GBS and GC/CT collected today. Checking wet prep as well. Cervix 0.5 cm/thick/ballotable.  6. Candidal vulvovaginitis during pregnancy Clinical diagnosis based on thick crud-like vaginal discharge and vaginal itching. - fluconazole  (DIFLUCAN ) 150 MG tablet; Take 1 tablet (150 mg total) by mouth once for 1 dose.  Dispense: 1 tablet; Refill: 0   Term labor symptoms  and general obstetric precautions including but not limited to vaginal bleeding, contractions, leaking of fluid and fetal movement were reviewed in detail with the patient. Please refer to After Visit Summary for other counseling recommendations.   Return in about 4 days (around 02/18/2024) for induction.  Future Appointments  Date Time Provider Department Center  02/18/2024 12:00 AM [facility] SCHED ROOM [facility] None    Joesph DELENA Sear, PA "

## 2024-02-14 NOTE — Patient Instructions (Addendum)
 Congratulations, you're on your way to having your baby!!!   You now have an induction scheduled.   Your induction is scheduled for midnight, so you will arrive at 11:45pm on Sunday January 25. You will arrive at the [facility] (Entrance C) and tell them you are there for your induction. The hospital will only call you to not come if they have NO beds available and need to postpone your admission time.    You will also get a call from the pre-admission nurse to go over pre-admission screen about 2 days prior to your induction date.   Things to Try After 37 weeks to Encourage Labor/Get Ready for Labor:    Try the [employer] at [email or web address] daily to improve baby's position and encourage the onset of labor.  Walk a little and rest a little every day.  Change positions often.  Cervical Ripening: May try one or both Red Raspberry Leaf capsules or tea:  two 300mg  or 400mg  tablets with each meal, 2-3 times a day, or 1-3 cups of tea daily  Potential Side Effects Of Raspberry Leaf:  Most women do not experience any side effects from drinking raspberry leaf tea. However, nausea and loose stools are possible   Evening Primrose Oil capsules: take 1 capsule by mouth and place one capsule in the vagina every night.    Some of the potential side effects:  Upset stomach  Loose stools or diarrhea  Headaches  Nausea  Sex can also help the cervix ripen and encourage labor onset.   [facility] Delivery Visitors: Laboring women may have one designated support person and two other visitors of any age visit. The support person must remain the same. Any child must be supervised at all times by a person aged 38 or over who is not the patient.  The visitors may switch with other visitors. Visitation is permitted 24 hours per day. A doula registered with [facility] for labor and delivery support is not considered a visitor. Doulas not registered with   are considered visitors.   Reasons to go to [facility] at [facility]: You begin to have strong, frequent contractions 5 minutes apart or less, each last 1 minute, these have been going on for 1-2 hours, and you cannot walk or talk during them. Your water breaks.  Sometimes it is a big gush of fluid. However, many times it may it may be much more subtle. You should go to the hospital if you have a constant leakage of fluid from your vagina, enough to soak a pad when you are walking around.  You have vaginal bleeding.  It is normal to have a small amount of spotting if your cervix was checked. If you have bleeding requiring the use of a pad, go to the hospital. You don't feel your baby moving like normal.  If you think that you babys movement is decreased, eat a snack and rest on your left side in a quiet room for one hour. If you have not felt the baby move more than 6 times in an hour GO TO THE HOSPITAL.

## 2024-02-15 ENCOUNTER — Telehealth (HOSPITAL_COMMUNITY): Payer: Self-pay | Admitting: *Deleted

## 2024-02-15 ENCOUNTER — Ambulatory Visit: Payer: Self-pay | Admitting: Family Medicine

## 2024-02-15 LAB — CERVICOVAGINAL ANCILLARY ONLY
Bacterial Vaginitis (gardnerella): NEGATIVE
Candida Glabrata: NEGATIVE
Candida Vaginitis: POSITIVE — AB
Chlamydia: NEGATIVE
Comment: NEGATIVE
Comment: NEGATIVE
Comment: NEGATIVE
Comment: NEGATIVE
Comment: NEGATIVE
Comment: NORMAL
Neisseria Gonorrhea: NEGATIVE
Trichomonas: NEGATIVE

## 2024-02-15 NOTE — Telephone Encounter (Signed)
 Preadmission screen

## 2024-02-17 ENCOUNTER — Inpatient Hospital Stay (HOSPITAL_COMMUNITY)
Admission: RE | Admit: 2024-02-17 | Discharge: 2024-02-20 | DRG: 768 | Disposition: A | Discharge status: Home / Self Care | Attending: Family Medicine | Admitting: Family Medicine

## 2024-02-17 ENCOUNTER — Encounter (HOSPITAL_COMMUNITY): Payer: Self-pay | Admitting: Family Medicine

## 2024-02-17 ENCOUNTER — Other Ambulatory Visit: Payer: Self-pay

## 2024-02-17 DIAGNOSIS — Z8659 Personal history of other mental and behavioral disorders: Secondary | ICD-10-CM

## 2024-02-17 DIAGNOSIS — Z8249 Family history of ischemic heart disease and other diseases of the circulatory system: Secondary | ICD-10-CM

## 2024-02-17 DIAGNOSIS — Z79899 Other long term (current) drug therapy: Secondary | ICD-10-CM

## 2024-02-17 DIAGNOSIS — O0990 Supervision of high risk pregnancy, unspecified, unspecified trimester: Principal | ICD-10-CM

## 2024-02-17 DIAGNOSIS — O99210 Obesity complicating pregnancy, unspecified trimester: Secondary | ICD-10-CM | POA: Diagnosis present

## 2024-02-17 DIAGNOSIS — D62 Acute posthemorrhagic anemia: Secondary | ICD-10-CM | POA: Diagnosis not present

## 2024-02-17 DIAGNOSIS — Z833 Family history of diabetes mellitus: Secondary | ICD-10-CM | POA: Diagnosis not present

## 2024-02-17 DIAGNOSIS — Z7982 Long term (current) use of aspirin: Secondary | ICD-10-CM | POA: Diagnosis not present

## 2024-02-17 DIAGNOSIS — O99214 Obesity complicating childbirth: Secondary | ICD-10-CM | POA: Diagnosis present

## 2024-02-17 DIAGNOSIS — O9081 Anemia of the puerperium: Secondary | ICD-10-CM | POA: Diagnosis not present

## 2024-02-17 DIAGNOSIS — Z3A38 38 weeks gestation of pregnancy: Secondary | ICD-10-CM | POA: Diagnosis not present

## 2024-02-17 DIAGNOSIS — O09899 Supervision of other high risk pregnancies, unspecified trimester: Secondary | ICD-10-CM

## 2024-02-17 DIAGNOSIS — E66813 Obesity, class 3: Secondary | ICD-10-CM | POA: Diagnosis present

## 2024-02-17 DIAGNOSIS — Z880 Allergy status to penicillin: Secondary | ICD-10-CM

## 2024-02-17 DIAGNOSIS — Z1331 Encounter for screening for depression: Secondary | ICD-10-CM

## 2024-02-17 DIAGNOSIS — O365930 Maternal care for other known or suspected poor fetal growth, third trimester, not applicable or unspecified: Secondary | ICD-10-CM | POA: Diagnosis present

## 2024-02-17 DIAGNOSIS — Z8742 Personal history of other diseases of the female genital tract: Secondary | ICD-10-CM

## 2024-02-17 DIAGNOSIS — F411 Generalized anxiety disorder: Secondary | ICD-10-CM | POA: Diagnosis present

## 2024-02-17 DIAGNOSIS — Z3490 Encounter for supervision of normal pregnancy, unspecified, unspecified trimester: Secondary | ICD-10-CM | POA: Diagnosis present

## 2024-02-17 DIAGNOSIS — D508 Other iron deficiency anemias: Secondary | ICD-10-CM

## 2024-02-17 LAB — TYPE AND SCREEN
ABO/RH(D): A POS
Antibody Screen: NEGATIVE

## 2024-02-17 LAB — CBC
HCT: 30.3 % — ABNORMAL LOW (ref 36.0–46.0)
Hemoglobin: 10.7 g/dL — ABNORMAL LOW (ref 12.0–15.0)
MCH: 32 pg (ref 26.0–34.0)
MCHC: 35.3 g/dL (ref 30.0–36.0)
MCV: 90.7 fL (ref 80.0–100.0)
Platelets: 164 10*3/uL (ref 150–400)
RBC: 3.34 MIL/uL — ABNORMAL LOW (ref 3.87–5.11)
RDW: 13 % (ref 11.5–15.5)
WBC: 5 10*3/uL (ref 4.0–10.5)
nRBC: 0 % (ref 0.0–0.2)

## 2024-02-17 LAB — GROUP B STREP BY PCR: Group B strep by PCR: NEGATIVE

## 2024-02-17 MED ORDER — TERBUTALINE SULFATE 1 MG/ML IJ SOLN: 0.2500 mg | Freq: Once | INTRAMUSCULAR | Status: DC | PRN

## 2024-02-17 MED ORDER — SERTRALINE HCL 50 MG PO TABS
50.0000 mg | ORAL_TABLET | Freq: Every day | ORAL | Status: DC
  Filled 2024-02-17 (×2): qty 1

## 2024-02-17 MED ORDER — OXYTOCIN BOLUS FROM INFUSION
333.0000 mL | Freq: Once | INTRAVENOUS | Status: AC
  Administered 2024-02-18: 333 mL via INTRAVENOUS

## 2024-02-17 MED ORDER — PENICILLIN G POT IN DEXTROSE 60000 UNIT/ML IV SOLN: 3.0000 10*6.[IU] | INTRAVENOUS | Status: DC

## 2024-02-17 MED ORDER — OXYTOCIN-SODIUM CHLORIDE 30-0.9 UT/500ML-% IV SOLN
2.5000 [IU]/h | INTRAVENOUS | Status: DC
  Filled 2024-02-17: qty 500

## 2024-02-17 MED ORDER — LACTATED RINGERS IV SOLN: INTRAVENOUS | Status: DC

## 2024-02-17 MED ORDER — ONDANSETRON HCL 4 MG/2ML IJ SOLN
4.0000 mg | Freq: Four times a day (QID) | INTRAMUSCULAR | Status: DC | PRN
  Administered 2024-02-18 (×2): 4 mg via INTRAVENOUS
  Filled 2024-02-17 (×2): qty 2

## 2024-02-17 MED ORDER — CEFAZOLIN SODIUM-DEXTROSE 2-4 GM/100ML-% IV SOLN
2.0000 g | Freq: Once | INTRAVENOUS | Status: AC
  Administered 2024-02-17: 2 g via INTRAVENOUS
  Filled 2024-02-17: qty 100

## 2024-02-17 MED ORDER — DIPHENHYDRAMINE HCL 25 MG PO CAPS: 25.0000 mg | ORAL_CAPSULE | Freq: Four times a day (QID) | ORAL | Status: DC | PRN

## 2024-02-17 MED ORDER — FENTANYL CITRATE (PF) 100 MCG/2ML IJ SOLN: 50.0000 ug | INTRAMUSCULAR | Status: DC | PRN

## 2024-02-17 MED ORDER — SOD CITRATE-CITRIC ACID 500-334 MG/5ML PO SOLN: 30.0000 mL | ORAL | Status: DC | PRN

## 2024-02-17 MED ORDER — LACTATED RINGERS IV SOLN: 500.0000 mL | INTRAVENOUS | Status: DC | PRN

## 2024-02-17 MED ORDER — CEFAZOLIN SODIUM-DEXTROSE 1-4 GM/50ML-% IV SOLN
1.0000 g | Freq: Three times a day (TID) | INTRAVENOUS | Status: DC
  Administered 2024-02-18: 1 g via INTRAVENOUS
  Filled 2024-02-17 (×3): qty 50

## 2024-02-17 MED ORDER — LIDOCAINE HCL (PF) 1 % IJ SOLN: 30.0000 mL | INTRAMUSCULAR | Status: DC | PRN

## 2024-02-17 MED ORDER — MISOPROSTOL 25 MCG QUARTER TABLET
25.0000 ug | ORAL_TABLET | ORAL | Status: DC
  Administered 2024-02-17: 25 ug via VAGINAL
  Filled 2024-02-17: qty 1

## 2024-02-17 MED ORDER — ACETAMINOPHEN 325 MG PO TABS: 650.0000 mg | ORAL_TABLET | ORAL | Status: DC | PRN

## 2024-02-17 MED ORDER — SODIUM CHLORIDE 0.9 % IV SOLN: 5.0000 10*6.[IU] | Freq: Once | INTRAVENOUS | Status: DC

## 2024-02-17 NOTE — Plan of Care (Signed)
" °  Problem: Education: Goal: Knowledge of General Education information will improve Description: Including pain rating scale, medication(s)/side effects and non-pharmacologic comfort measures Outcome: Progressing   Problem: Health Behavior/Discharge Planning: Goal: Ability to manage health-related needs will improve Outcome: Progressing   Problem: Clinical Measurements: Goal: Ability to maintain clinical measurements within normal limits will improve Outcome: Progressing Goal: Will remain free from infection Outcome: Progressing Goal: Diagnostic test results will improve Outcome: Progressing Goal: Respiratory complications will improve Outcome: Progressing Goal: Cardiovascular complication will be avoided Outcome: Progressing   Problem: Activity: Goal: Risk for activity intolerance will decrease Outcome: Progressing   Problem: Nutrition: Goal: Adequate nutrition will be maintained Outcome: Progressing   Problem: Coping: Goal: Level of anxiety will decrease Outcome: Progressing   Problem: Elimination: Goal: Will not experience complications related to bowel motility Outcome: Progressing Goal: Will not experience complications related to urinary retention Outcome: Progressing   Problem: Pain Managment: Goal: General experience of comfort will improve and/or be controlled Outcome: Progressing   Problem: Safety: Goal: Ability to remain free from injury will improve Outcome: Progressing   Problem: Skin Integrity: Goal: Risk for impaired skin integrity will decrease Outcome: Progressing   Problem: Education: Goal: Knowledge of Childbirth will improve Outcome: Progressing Goal: Ability to make informed decisions regarding treatment and plan of care will improve Outcome: Progressing Goal: Ability to state and carry out methods to decrease the pain will improve Outcome: Progressing   Problem: Coping: Goal: Ability to verbalize concerns and feelings about labor and  delivery will improve Outcome: Progressing   Problem: Life Cycle: Goal: Ability to make normal progression through stages of labor will improve Outcome: Progressing Goal: Ability to effectively push during vaginal delivery will improve Outcome: Progressing   Problem: Role Relationship: Goal: Will demonstrate positive interactions with the child Outcome: Progressing   Problem: Safety: Goal: Risk of complications during labor and delivery will decrease Outcome: Progressing   Problem: Pain Management: Goal: Relief or control of pain from uterine contractions will improve Outcome: Progressing   Problem: Education: Goal: Knowledge of disease or condition will improve Outcome: Progressing Goal: Knowledge of the prescribed therapeutic regimen will improve Outcome: Progressing   Problem: Fluid Volume: Goal: Peripheral tissue perfusion will improve Outcome: Progressing   Problem: Clinical Measurements: Goal: Complications related to disease process, condition or treatment will be avoided or minimized Outcome: Progressing   "

## 2024-02-17 NOTE — Progress Notes (Addendum)
 IV obtained by staff RN.

## 2024-02-17 NOTE — H&P (Signed)
 OBSTETRIC ADMISSION HISTORY AND PHYSICAL  Michele Fox is a 29 y.o. female G3P2002 with IUP at [redacted]w[redacted]d by 12w US  presenting for IOL for FGR. She reports +FMs, No LOF, no VB, no blurry vision, headaches or peripheral edema, and RUQ pain.  She plans on breast feeding. She request Depo for birth control. She received her prenatal care at [facility]   Dating: By 12w US  --->  Estimated Date of Delivery: 02/25/24  Sono:    @[redacted]w[redacted]d , CWD, normal anatomy, cephalic presentation, 2840g, 85% EFW   Prenatal History/Complications: FGR, short interval pregnancy, maternal obesity, GAD, hx gHTN, hx uterine inversion, hx PPH  Past Medical History: Past Medical History:  Diagnosis Date   Anemia    Group beta Strep positive 08/12/2018   Incomplete uterine prolapse 08/12/2018   With delivery   Postpartum hemorrhage 08/12/2018   Pregnancy 10/19/2022   Uterine inversion associated with pregnancy 08/16/2018    Past Surgical History: Past Surgical History:  Procedure Laterality Date   UTERINE SEPTUM RESECTION N/A 08/17/2018   Procedure: REVERSAL OF UTERINE INVERSION WITH PLACEMENT BAKRI BALLOON;  Surgeon: Edsel Norleen GAILS, MD;  Location: [facility] OR;  Service: Gynecology;  Laterality: N/A;    Obstetrical History: OB History     Gravida  3   Para  2   Term  2   Preterm  0   AB  0   Living  2      SAB  0   IAB  0   Ectopic  0   Multiple  0   Live Births  2           Social History Social History   Socioeconomic History   Marital status: Single    Spouse name: Not on file   Number of children: Not on file   Years of education: Not on file   Highest education level: Not on file  Occupational History   Not on file  Tobacco Use   Smoking status: Never   Smokeless tobacco: Never  Vaping Use   Vaping status: Never Used  Substance and Sexual Activity   Alcohol use: Not Currently    Comment: occ, prior to pregnancy   Drug use: Never   Sexual activity: Yes    Partners: Male     Birth control/protection: None  Other Topics Concern   Not on file  Social History Narrative   Not on file   Social Drivers of Health   Tobacco Use: Low Risk (01/14/2024)   Patient History    Smoking Tobacco Use: Never    Smokeless Tobacco Use: Never    Passive Exposure: Not on file  Financial Resource Strain: Not on file  Food Insecurity: No Food Insecurity (02/17/2024)   Epic    Worried About Programme Researcher, Broadcasting/film/video in the Last Year: Never true    Ran Out of Food in the Last Year: Never true  Transportation Needs: No Transportation Needs (02/17/2024)   Epic    Lack of Transportation (Medical): No    Lack of Transportation (Non-Medical): No  Physical Activity: Not on file  Stress: Not on file  Social Connections: Not on file  Depression (EYV7-0): High Risk (12/31/2023)   Depression (PHQ2-9)    PHQ-2 Score: 13  Alcohol Screen: Not on file  Housing: Low Risk (02/17/2024)   Epic    Unable to Pay for Housing in the Last Year: No    Number of Times Moved in the Last Year: 1    Homeless  in the Last Year: No  Utilities: Not At Risk (02/17/2024)   Epic    Threatened with loss of utilities: No  Health Literacy: Not on file    Family History: Family History  Problem Relation Age of Onset   Arthritis Mother        rheumatoid   Hypertension Mother    Gout Mother    Diabetes Father    Diabetes Paternal Grandmother     Allergies: Allergies[1]  Medications Prior to Admission  Medication Sig Dispense Refill Last Dose/Taking   aspirin  81 MG chewable tablet Chew 1 tablet (81 mg total) by mouth daily. 90 tablet 4 02/16/2024   ferrous sulfate  325 (65 FE) MG tablet Take 1 tablet (325 mg total) by mouth every other day. 45 tablet 3 Past Week   Prenatal Vit-Fe Phos-FA-Omega (VITAFOL  GUMMIES) 3.33-0.333-34.8 MG CHEW Chew 3 tablets by mouth daily. 90 tablet 11 Past Week   promethazine  (PHENERGAN ) 25 MG tablet Take 1 tablet (25 mg total) by mouth every 6 (six) hours as needed for nausea or  vomiting. 30 tablet 2 Past Week   sertraline  (ZOLOFT ) 50 MG tablet Take 1 tablet (50 mg total) by mouth daily. Take 1/2 tab x 5-7 days, then take whole tablet 30 tablet 3 Unknown   Blood Pressure Monitoring (BLOOD PRESSURE KIT) DEVI 1 Device by Does not apply route once a week. 1 each 0    Ferric Maltol  (ACCRUFER ) 30 MG CAPS Take 1 capsule (30 mg total) by mouth in the morning and at bedtime. (Patient not taking: No sig reported) 60 capsule 5 Unknown     Review of Systems   All systems reviewed and negative except as stated in HPI  Blood pressure (!) 141/71, pulse 74, temperature 98.3 F (36.8 C), temperature source Oral, resp. rate 20, height 5' 2 (1.575 m), weight 121.7 kg, not currently breastfeeding. General appearance: alert, cooperative, appears stated age, and no distress Lungs: clear to auscultation bilaterally Heart: regular rate and rhythm Abdomen: soft, non-tender; bowel sounds normal Extremities: Homans sign is negative, no sign of DVT Presentation: cephalic Fetal monitoringBaseline: 120 bpm, Variability: Good {> 6 bpm), Accelerations: Reactive, and Decelerations: Absent Uterine activityNone     Prenatal labs: ABO, Rh: A/Positive/-- (07/23 0940) Antibody: Negative (07/23 0940) Rubella: 6.71 (07/23 0940) RPR: Non Reactive (12/12 1250)  HBsAg: Negative (07/23 0940)  HIV: Non Reactive (12/12 1250)  GBS:      Lab Results  Component Value Date   GBS Positive (A) 10/04/2022   GTT normal 2 hour Genetic screening  low-risk Anatomy US  normal  Immunization History  Administered Date(s) Administered    sv, Bivalent, Protein Subunit Rsvpref,pf Marlow) 12/31/2023   Influenza, Seasonal, Injecte, Preservative Fre 12/31/2023   Influenza-Unspecified 03/02/2018   Tdap 08/30/2022, 12/31/2023    Prenatal Transfer Tool  Maternal Diabetes: No Genetic Screening: Normal Maternal Ultrasounds/Referrals: IUGR Fetal Ultrasounds or other Referrals:  None Maternal Substance  Abuse:  No Significant Maternal Medications:  None Significant Maternal Lab Results: GBS unknown Number of Prenatal Visits:greater than 3 verified prenatal visits Maternal Vaccinations:RSV: Given during pregnancy >/=14 days ago, TDap, Flu, and Covid Other Comments:  None   No results found for this or any previous visit (from the past 24 hours).  Patient Active Problem List   Diagnosis Date Noted   Encounter for induction of labor 02/17/2024   Poor fetal growth affecting management of mother in third trimester 02/14/2024   Generalized anxiety disorder 12/31/2023   History of postpartum hemorrhage,  currently pregnant 08/22/2023   BMI 40.0-44.9, adult (HCC) 08/22/2023   Short interval between pregnancies affecting pregnancy, antepartum 08/22/2023   History of gestational hypertension 08/22/2023   History of uterine inversion 08/22/2023   Supervision of high risk pregnancy, antepartum 08/15/2023   Obesity affecting pregnancy 06/12/2022    Assessment/Plan:  Michele Fox is a 29 y.o. G3P2002 at [redacted]w[redacted]d here for IOL for FGR  #Labor: IOL, prefers to start with misoprostol , open to foley balloon if SVE unchanged at next exam #Pain: Per patient request #FWB: Category I #GBS status:  Unknown, PCR pending, does have PCN allergy, will start on Ancef  while awaiting results, has been positive in previous 2 pregnancies #Feeding: Breastmilk  #Reproductive Life planning: Depo Provera  #Circ:  yes  Charlie DELENA Courts, MD  02/17/2024, 8:30 PM       [1]  Allergies Allergen Reactions   Penicillins Rash

## 2024-02-18 ENCOUNTER — Encounter (HOSPITAL_COMMUNITY): Payer: Self-pay | Admitting: Obstetrics and Gynecology

## 2024-02-18 ENCOUNTER — Inpatient Hospital Stay (HOSPITAL_COMMUNITY): Admitting: Anesthesiology

## 2024-02-18 ENCOUNTER — Inpatient Hospital Stay (HOSPITAL_COMMUNITY)
Admission: RE | Admit: 2024-02-18 | Source: Ambulatory Visit | Attending: Obstetrics & Gynecology | Admitting: Obstetrics & Gynecology

## 2024-02-18 LAB — COMPREHENSIVE METABOLIC PANEL WITH GFR
ALT: 9 U/L (ref 0–44)
AST: 16 U/L (ref 15–41)
Albumin: 3.1 g/dL — ABNORMAL LOW (ref 3.5–5.0)
Alkaline Phosphatase: 76 U/L (ref 38–126)
Anion gap: 10 (ref 5–15)
BUN: 8 mg/dL (ref 6–20)
CO2: 22 mmol/L (ref 22–32)
Calcium: 8.5 mg/dL — ABNORMAL LOW (ref 8.9–10.3)
Chloride: 104 mmol/L (ref 98–111)
Creatinine, Ser: 0.75 mg/dL (ref 0.44–1.00)
GFR, Estimated: >60 mL/min
Glucose, Bld: 82 mg/dL (ref 70–99)
Potassium: 4 mmol/L (ref 3.5–5.1)
Sodium: 135 mmol/L (ref 135–145)
Total Bilirubin: 0.4 mg/dL (ref 0.0–1.2)
Total Protein: 6.2 g/dL — ABNORMAL LOW (ref 6.5–8.1)

## 2024-02-18 LAB — SYPHILIS: RPR W/REFLEX TO RPR TITER AND TREPONEMAL ANTIBODIES, TRADITIONAL SCREENING AND DIAGNOSIS ALGORITHM: RPR Ser Ql: NONREACTIVE

## 2024-02-18 LAB — PROTEIN / CREATININE RATIO, URINE
Creatinine, Urine: 73 mg/dL
Protein Creatinine Ratio: 0.1 mg/mg
Total Protein, Urine: 10 mg/dL

## 2024-02-18 MED ORDER — EPHEDRINE 5 MG/ML INJ: 10.0000 mg | INTRAVENOUS | Status: DC | PRN

## 2024-02-18 MED ORDER — DIPHENHYDRAMINE HCL 50 MG/ML IJ SOLN: 12.5000 mg | INTRAMUSCULAR | Status: DC | PRN

## 2024-02-18 MED ORDER — COCONUT OIL OIL
1.0000 | TOPICAL_OIL | Status: DC | PRN
  Administered 2024-02-20: 1 via TOPICAL

## 2024-02-18 MED ORDER — PHENYLEPHRINE 80 MCG/ML (10ML) SYRINGE FOR IV PUSH (FOR BLOOD PRESSURE SUPPORT)
80.0000 ug | PREFILLED_SYRINGE | INTRAVENOUS | Status: DC | PRN
  Filled 2024-02-18: qty 10

## 2024-02-18 MED ORDER — SODIUM CHLORIDE 0.9 % IV SOLN
INTRAVENOUS | Status: DC | PRN
  Administered 2024-02-18: 7 mL via EPIDURAL

## 2024-02-18 MED ORDER — SIMETHICONE 80 MG PO CHEW: 80.0000 mg | CHEWABLE_TABLET | ORAL | Status: DC | PRN

## 2024-02-18 MED ORDER — METHYLERGONOVINE MALEATE 0.2 MG/ML IJ SOLN: 0.2000 mg | Freq: Once | INTRAMUSCULAR | Status: DC

## 2024-02-18 MED ORDER — TRANEXAMIC ACID-NACL 1000-0.7 MG/100ML-% IV SOLN: 1000.0000 mg | Freq: Once | INTRAVENOUS | Status: DC | PRN

## 2024-02-18 MED ORDER — SODIUM CHLORIDE 0.9 % IV SOLN: 250.0000 mL | INTRAVENOUS | Status: DC | PRN

## 2024-02-18 MED ORDER — LACTATED RINGERS IV SOLN
500.0000 mL | Freq: Once | INTRAVENOUS | Status: AC
  Administered 2024-02-18: 500 mL via INTRAVENOUS

## 2024-02-18 MED ORDER — MISOPROSTOL 200 MCG PO TABS
ORAL_TABLET | ORAL | Status: AC
  Filled 2024-02-18: qty 4

## 2024-02-18 MED ORDER — LIDOCAINE HCL (PF) 1 % IJ SOLN
INTRAMUSCULAR | Status: DC | PRN
  Administered 2024-02-18: 3 mL via SUBCUTANEOUS

## 2024-02-18 MED ORDER — FENTANYL-BUPIVACAINE-NACL 0.5-0.125-0.9 MG/250ML-% EP SOLN
12.0000 mL/h | EPIDURAL | Status: DC | PRN
  Administered 2024-02-18: 12 mL/h via EPIDURAL
  Filled 2024-02-18: qty 250

## 2024-02-18 MED ORDER — SODIUM CHLORIDE 0.9% FLUSH: 3.0000 mL | INTRAVENOUS | Status: DC | PRN

## 2024-02-18 MED ORDER — PRENATAL MULTIVITAMIN CH
1.0000 | ORAL_TABLET | Freq: Every day | ORAL | Status: DC
  Administered 2024-02-19 – 2024-02-20 (×2): 1 via ORAL
  Filled 2024-02-18 (×2): qty 1

## 2024-02-18 MED ORDER — TERBUTALINE SULFATE 1 MG/ML IJ SOLN: 0.2500 mg | Freq: Once | INTRAMUSCULAR | Status: DC | PRN

## 2024-02-18 MED ORDER — DIPHENHYDRAMINE HCL 25 MG PO CAPS: 25.0000 mg | ORAL_CAPSULE | Freq: Four times a day (QID) | ORAL | Status: DC | PRN

## 2024-02-18 MED ORDER — CEFAZOLIN SODIUM-DEXTROSE 2-4 GM/100ML-% IV SOLN: 2.0000 g | Freq: Once | INTRAVENOUS | Status: DC

## 2024-02-18 MED ORDER — ONDANSETRON HCL 4 MG/2ML IJ SOLN: 4.0000 mg | INTRAMUSCULAR | Status: DC | PRN

## 2024-02-18 MED ORDER — MISOPROSTOL 200 MCG PO TABS
800.0000 ug | ORAL_TABLET | Freq: Once | ORAL | Status: AC
  Administered 2024-02-18: 800 ug via RECTAL

## 2024-02-18 MED ORDER — TRANEXAMIC ACID-NACL 1000-0.7 MG/100ML-% IV SOLN: 1000.0000 mg | INTRAVENOUS | Status: DC

## 2024-02-18 MED ORDER — SENNOSIDES-DOCUSATE SODIUM 8.6-50 MG PO TABS
2.0000 | ORAL_TABLET | ORAL | Status: DC
  Administered 2024-02-19 – 2024-02-20 (×2): 2 via ORAL
  Filled 2024-02-18 (×2): qty 2

## 2024-02-18 MED ORDER — SODIUM CHLORIDE 0.9% FLUSH
3.0000 mL | Freq: Two times a day (BID) | INTRAVENOUS | Status: DC
  Administered 2024-02-18 – 2024-02-19 (×2): 3 mL via INTRAVENOUS

## 2024-02-18 MED ORDER — TETANUS-DIPHTH-ACELL PERTUSSIS 5-2-15.5 LF-MCG/0.5 IM SUSP: 0.5000 mL | Freq: Once | INTRAMUSCULAR | Status: DC

## 2024-02-18 MED ORDER — ACETAMINOPHEN 325 MG PO TABS
650.0000 mg | ORAL_TABLET | ORAL | Status: DC | PRN
  Administered 2024-02-18 – 2024-02-19 (×3): 650 mg via ORAL
  Filled 2024-02-18 (×3): qty 2

## 2024-02-18 MED ORDER — LIDOCAINE-EPINEPHRINE (PF) 2 %-1:200000 IJ SOLN
INTRAMUSCULAR | Status: DC | PRN
  Administered 2024-02-18: 3 mL via EPIDURAL

## 2024-02-18 MED ORDER — DIBUCAINE (PERIANAL) 1 % EX OINT: 1.0000 | TOPICAL_OINTMENT | CUTANEOUS | Status: DC | PRN

## 2024-02-18 MED ORDER — GUAIFENESIN ER 600 MG PO TB12
600.0000 mg | ORAL_TABLET | Freq: Two times a day (BID) | ORAL | Status: DC | PRN
  Administered 2024-02-18: 600 mg via ORAL
  Filled 2024-02-18 (×2): qty 1

## 2024-02-18 MED ORDER — TRANEXAMIC ACID-NACL 1000-0.7 MG/100ML-% IV SOLN
INTRAVENOUS | Status: AC
  Administered 2024-02-18: 1000 mg
  Filled 2024-02-18: qty 100

## 2024-02-18 MED ORDER — OXYTOCIN-SODIUM CHLORIDE 30-0.9 UT/500ML-% IV SOLN
1.0000 m[IU]/min | INTRAVENOUS | Status: DC
  Administered 2024-02-18: 2 m[IU]/min via INTRAVENOUS

## 2024-02-18 MED ORDER — CEFAZOLIN SODIUM-DEXTROSE 2-4 GM/100ML-% IV SOLN
2.0000 g | Freq: Once | INTRAVENOUS | Status: DC
  Administered 2024-02-18: 2 g via INTRAVENOUS

## 2024-02-18 MED ORDER — OXYCODONE HCL 5 MG PO TABS
5.0000 mg | ORAL_TABLET | Freq: Four times a day (QID) | ORAL | Status: DC | PRN
  Administered 2024-02-19 (×2): 5 mg via ORAL
  Filled 2024-02-18 (×2): qty 1

## 2024-02-18 MED ORDER — METHYLERGONOVINE MALEATE 0.2 MG/ML IJ SOLN
INTRAMUSCULAR | Status: AC
  Administered 2024-02-18: 0.2 mg
  Filled 2024-02-18: qty 1

## 2024-02-18 MED ORDER — BENZOCAINE-MENTHOL 20-0.5 % EX AERO
1.0000 | INHALATION_SPRAY | CUTANEOUS | Status: DC | PRN
  Administered 2024-02-20 (×2): 1 via TOPICAL
  Filled 2024-02-18 (×2): qty 56

## 2024-02-18 MED ORDER — MEASLES, MUMPS & RUBELLA VAC ~~LOC~~ SUSR: 0.5000 mL | Freq: Once | SUBCUTANEOUS | Status: DC

## 2024-02-18 MED ORDER — WITCH HAZEL-GLYCERIN EX PADS: 1.0000 | MEDICATED_PAD | CUTANEOUS | Status: DC | PRN

## 2024-02-18 MED ORDER — PHENYLEPHRINE 80 MCG/ML (10ML) SYRINGE FOR IV PUSH (FOR BLOOD PRESSURE SUPPORT): 80.0000 ug | PREFILLED_SYRINGE | INTRAVENOUS | Status: DC | PRN

## 2024-02-18 MED ORDER — ONDANSETRON HCL 4 MG PO TABS: 4.0000 mg | ORAL_TABLET | ORAL | Status: DC | PRN

## 2024-02-18 MED ORDER — IBUPROFEN 800 MG PO TABS
800.0000 mg | ORAL_TABLET | Freq: Three times a day (TID) | ORAL | Status: DC
  Administered 2024-02-18 – 2024-02-20 (×6): 800 mg via ORAL
  Filled 2024-02-18 (×7): qty 1

## 2024-02-18 NOTE — Progress Notes (Signed)
 Patient ID: Michele Fox, female   DOB: 07/19/1995, 29 y.o.   MRN: [id number]  Blood pressure 136/87, pulse 62, temperature 97.7 F (36.5 C), temperature source Oral, resp. rate 17, height 5' 2 (1.575 m), weight 121.7 kg, SpO2 100%, not currently breastfeeding.  FHT: baseline 120/moderate variability/+accels/-decels Toco: ctx q32min  Dilation: 3.5 Effacement (%): 50 Station: -3 Presentation: Vertex Exam by:: Dr. Jomarie  Seen at bedside for SVE, is making slow change. Discussed options for augmentation, including pitocin  and AROM. Patient prefers to continue with expectant management. Has epidural for pain management. All questions answered.  Michele DELENA Jomarie, MD

## 2024-02-18 NOTE — Anesthesia Procedure Notes (Signed)
 Epidural Patient location during procedure: OB Start time: 02/18/2024 3:02 AM End time: 02/18/2024 3:06 AM  Staffing Anesthesiologist: Boone Fess, MD Performed: anesthesiologist   Preanesthetic Checklist Completed: patient identified, IV checked, site marked, risks and benefits discussed, surgical consent, monitors and equipment checked, pre-op evaluation and timeout performed  Epidural Patient position: sitting Prep: ChloraPrep Patient monitoring: heart rate, continuous pulse ox and blood pressure Approach: midline Location: L3-L4 Injection technique: LOR saline  Needle:  Needle type: Tuohy  Needle gauge: 17 G Needle length: 9 cm Needle insertion depth: 8 cm Catheter type: closed end flexible Catheter size: 19 Gauge Catheter at skin depth: 13 cm Test dose: negative and 1.5% lidocaine  with Epi 1:200 K  Assessment Sensory level: T10 Events: blood not aspirated, no cerebrospinal fluid, injection not painful, no injection resistance, no paresthesia and negative IV test  Additional Notes First/one attempt Pt. Evaluated and documentation done after procedure finished. Patient identified. Risks/Benefits/Options discussed with patient including but not limited to bleeding, infection, nerve damage, paralysis, failed block, incomplete pain control, headache, blood pressure changes, nausea, vomiting, reactions to medication both or allergic, itching and postpartum back pain. Confirmed with bedside nurse the patient's most recent platelet count. Confirmed with patient that they are not currently taking any anticoagulation, have any bleeding history or any family history of bleeding disorders. Patient expressed understanding and wished to proceed. All questions were answered. Sterile technique was used throughout the entire procedure. Please see nursing notes for vital signs. Test dose was given through epidural catheter and negative prior to continuing to dose epidural or start infusion.  Warning signs of high block given to the patient including shortness of breath, tingling/numbness in hands, complete motor block, or any concerning symptoms with instructions to call for help. Patient was given instructions on fall risk and not to get out of bed. All questions and concerns addressed with instructions to call with any issues or inadequate analgesia.     Patient tolerated the insertion well without immediate complications.  Reason for block: procedure for painReason for block:procedure for pain

## 2024-02-18 NOTE — Anesthesia Preprocedure Evaluation (Signed)
"                                    Anesthesia Evaluation  Patient identified by MRN, date of birth, ID band Patient awake    Reviewed: Allergy & Precautions, NPO status , Patient's Chart, lab work & pertinent test results  History of Anesthesia Complications Negative for: history of anesthetic complications  Airway Mallampati: III  TM Distance: >3 FB Neck ROM: Full    Dental no notable dental hx. (+) Teeth Intact   Pulmonary neg pulmonary ROS, neg sleep apnea, neg COPD, Patient abstained from smoking.Not current smoker   Pulmonary exam normal breath sounds clear to auscultation       Cardiovascular Exercise Tolerance: Good METS(-) hypertension(-) CAD and (-) Past MI negative cardio ROS (-) dysrhythmias  Rhythm:Regular Rate:Normal - Systolic murmurs    Neuro/Psych  PSYCHIATRIC DISORDERS Anxiety     negative neurological ROS     GI/Hepatic ,neg GERD  ,,(+)     (-) substance abuse    Endo/Other  neg diabetes  Class 3 obesity  Renal/GU negative Renal ROS     Musculoskeletal   Abdominal  (+) + obese  Peds  Hematology  (+) Blood dyscrasia, anemia Denies blood thinner use or bleeding disorders.    Anesthesia Other Findings Denies blood thinner use or bleeding diatheses. Recent labs reviewed. Past Medical History: No date: Anemia 08/12/2018: Group beta Strep positive 08/12/2018: Incomplete uterine prolapse     Comment:  With delivery 08/12/2018: Postpartum hemorrhage 10/19/2022: Pregnancy 08/16/2018: Uterine inversion associated with pregnancy   Reproductive/Obstetrics (+) Pregnancy                              Anesthesia Physical Anesthesia Plan  ASA: 3  Anesthesia Plan: Epidural   Post-op Pain Management:    Induction:   PONV Risk Score and Plan: 2 and Treatment may vary due to age or medical condition and Ondansetron   Airway Management Planned: Natural Airway  Additional Equipment:   Intra-op Plan:    Post-operative Plan:   Informed Consent: I have reviewed the patients History and Physical, chart, labs and discussed the procedure including the risks, benefits and alternatives for the proposed anesthesia with the patient or authorized representative who has indicated his/her understanding and acceptance.       Plan Discussed with: Surgeon  Anesthesia Plan Comments: (Discussed R/B/A of neuraxial anesthesia technique with patient: - rare risks of spinal/epidural hematoma, nerve damage, infection - Risk of PDPH - Risk of itching - Risk of nausea and vomiting - Risk of poor block necessitating replacement of epidural (increased due to higher BMI) - Risk of allergic reactions. Patient voiced understanding.)         Anesthesia Quick Evaluation  "

## 2024-02-18 NOTE — Discharge Summary (Signed)
 "    Postpartum Discharge Summary  Date of Service updated***     Patient Name: Michele Fox DOB: Jun 07, 1995 MRN: [id number]  Date of admission: 02/17/2024 Delivery date:02/18/2024 Delivering provider: MAGALI BARKLEY CROME Date of discharge: 02/18/2024  Admitting diagnosis: Encounter for induction of labor [Z34.90] Intrauterine pregnancy: [redacted]w[redacted]d     Secondary diagnosis:  Principal Problem:   Encounter for induction of labor Active Problems:   Obesity affecting pregnancy   Short interval between pregnancies affecting pregnancy, antepartum   History of uterine inversion   Generalized anxiety disorder   Poor fetal growth affecting management of mother in third trimester  Additional problems: Postpartum hemorrhage     Discharge diagnosis: Term Pregnancy Delivered and PPH                                              Post partum procedures:{Postpartum procedures:23558} Augmentation: AROM, Pitocin , and Cytotec  Complications: PPH w/ EBL 958  Hospital course: Induction of Labor With Vaginal Delivery   29 y.o. yo G3P2002 at [redacted]w[redacted]d was admitted to the hospital 02/17/2024 for induction of labor.  Indication for induction: FGR.  Patient had an uncomplicated labor course. Membrane Rupture Time/Date: 9:35 AM,02/18/2024  Delivery Method:Vaginal, Spontaneous Operative Delivery:N/A Episiotomy: None Lacerations:  None Details of delivery can be found in separate delivery note.  Patient had a postpartum course complicated by***. Patient is discharged home 02/18/24.  Newborn Data: Birth date:02/18/2024 Birth time:1:04 PM Gender:Female Living status:Living Apgars:8 ,9  Weight:3020 g  Magnesium Sulfate received: No BMZ received: No Rhophylac:N/A MMR:No T-DaP:Given prenatally Flu: Yes RSV Vaccine received: Yes Transfusion:{Transfusion received:30440034}  Immunizations received: Immunization History  Administered Date(s) Administered    sv, Bivalent, Protein Subunit Rsvpref,pf Marlow)  12/31/2023   Influenza, Seasonal, Injecte, Preservative Fre 12/31/2023   Influenza-Unspecified 03/02/2018   Tdap 08/30/2022, 12/31/2023    Physical exam  Vitals:   02/18/24 1104 02/18/24 1132 02/18/24 1202 02/18/24 1232  BP: (!) 122/55 (!) 106/93 (!) 124/49 (!) 112/48  Pulse: (!) 58 (!) 45 (!) 54 64  Resp:  16 16   Temp:      TempSrc:      SpO2:      Weight:      Height:       General: {Exam; general:21111117} Lochia: {Desc; appropriate/inappropriate:30686::appropriate} Uterine Fundus: {Desc; firm/soft:30687} Incision: {Exam; incision:21111123} DVT Evaluation: {Exam; icu:7888877} Labs: Lab Results  Component Value Date   WBC 5.0 02/17/2024   HGB 10.7 (L) 02/17/2024   HCT 30.3 (L) 02/17/2024   MCV 90.7 02/17/2024   PLT 164 02/17/2024      Latest Ref Rng & Units 02/18/2024    9:09 AM  CMP  Glucose 70 - 99 mg/dL 82   BUN 6 - 20 mg/dL 8   Creatinine 9.55 - 8.99 mg/dL 9.24   Sodium 864 - 854 mmol/L 135   Potassium 3.5 - 5.1 mmol/L 4.0   Chloride 98 - 111 mmol/L 104   CO2 22 - 32 mmol/L 22   Calcium 8.9 - 10.3 mg/dL 8.5   Total Protein 6.5 - 8.1 g/dL 6.2   Total Bilirubin 0.0 - 1.2 mg/dL 0.4   Alkaline Phos 38 - 126 U/L 76   AST 15 - 41 U/L 16   ALT 0 - 44 U/L 9    Edinburgh Score:    10/30/2022   11:01 AM  Edinburgh Postnatal Depression Scale  Screening Tool  I have been able to laugh and see the funny side of things. 0   I have looked forward with enjoyment to things. 0   I have blamed myself unnecessarily when things went wrong. 0   I have been anxious or worried for no good reason. 0   I have felt scared or panicky for no good reason. 0   Things have been getting on top of me. 2   I have been so unhappy that I have had difficulty sleeping. 2   I have felt sad or miserable. 0   I have been so unhappy that I have been crying. 2   The thought of harming myself has occurred to me. 0   Edinburgh Postnatal Depression Scale Total 6      Data saved with a  previous flowsheet row definition   No data recorded  After visit meds:  Allergies as of 02/18/2024       Reactions   Penicillins Rash   TOLERATED CEFAZOLIN      Med Rec must be completed prior to using this [facility]***        Discharge home in stable condition Infant Feeding: Breast Infant Disposition:home with mother Discharge instruction: per After Visit Summary and Postpartum booklet. Activity: Advance as tolerated. Pelvic rest for 6 weeks.  Diet: routine diet Future Appointments:No future appointments. Follow up Visit:  Message sent by Dr Manon 1/26 @ 1400 Please schedule this patient for a In person postpartum visit in 6 weeks with the following provider: Any provider. Additional Postpartum F/U:none  High risk pregnancy complicated by: maternal obesity, FGR, hx of GHTN, uterine inversion and PPH in prior pregnancies Delivery mode:  Vaginal, Spontaneous Anticipated Birth Control:  Nexplanon   02/18/2024 Leafy Manon, DO    "

## 2024-02-18 NOTE — Progress Notes (Addendum)
 Labor Progress Note Michele Fox is a 29 y.o. G3P2002 at [redacted]w[redacted]d presented for IOL 2/2 FGR S: Doing well after epidural  O:  BP 139/71   Pulse 62   Temp 97.9 F (36.6 C) (Oral)   Resp 16   Ht 5' 2 (1.575 m)   Wt 121.7 kg   LMP  (LMP Unknown)   SpO2 100%   BMI 49.07 kg/m   CVE: Dilation: 4.5 Effacement (%): 70 Station: -1 Presentation: Vertex Exam by:: Michele Fox/ Dr Michele Fox   A&P: 29 y.o. [id number] [redacted]w[redacted]d  #Labor: Making incremental cervical change. Attempted AROM, will retrial at next cervical check. S/p vag miso @2125  1/25, pit at 6 #Pain: s/p epidural #FWB: Cat 1, reactive   Michele Scriver, DO 9:33 AM   Attestation of Supervision of Student:  I confirm that I have verified the information documented in the resident's note and that I have also personally directly supervised the history, physical exam and all medical decision making activities.  I have verified that all services and findings are accurately documented in this student's note; and I agree with management and plan as outlined in the documentation. I have also made any necessary editorial changes.   Michele LITTIE Magali, MD OB Fellow 02/18/2024 9:42 AM

## 2024-02-18 NOTE — Progress Notes (Signed)
 Patient ID: Michele Fox, female   DOB: 12/27/1995, 29 y.o.   MRN: [id number]  Blood pressure 129/72, pulse (!) 58, temperature 97.7 F (36.5 C), temperature source Oral, resp. rate 18, height 5' 2 (1.575 m), weight 121.7 kg, SpO2 100%, not currently breastfeeding.  FHT: baseline 120/moderate variability/+accels/-decels Toco: ctx q19min  Dilation: 3 Effacement (%): 50 Station: -3 Presentation: Vertex Exam by:: Dr. Jomarie  Seen at bedside to discuss plan of care. Contractions spacing, now every 5 minutes. Discussed starting pitocin  for augmentation, patient amenable. Order placed. Patient comfortable with epidural.  Charlie DELENA Jomarie, MD

## 2024-02-18 NOTE — Progress Notes (Signed)
 Patient ID: Michele Fox, female   DOB: Feb 23, 1995, 29 y.o.   MRN: [id number]  Blood pressure 134/63, pulse 75, temperature 97.8 F (36.6 C), temperature source Oral, resp. rate 17, height 5' 2 (1.575 m), weight 121.7 kg, not currently breastfeeding.  FHT: baseline 130/moderate variability/+accels/-decels Toco: ctx q3-58min  Dilation: 3 Effacement (%): 30 Station: -3 Presentation: Vertex Exam by:: Rumaldo Potters RN  Seen at bedside to discuss plan of care. Contracting q33min. Talked through options including AROM, pitocin  and expectant management. Would prefer to be expectantly managed if making change. Plan for exam in 2 hours from previous. All questions answered.  Charlie DELENA Courts, MD

## 2024-02-19 LAB — CBC
HCT: 25.4 % — ABNORMAL LOW (ref 36.0–46.0)
Hemoglobin: 8.8 g/dL — ABNORMAL LOW (ref 12.0–15.0)
MCH: 32.1 pg (ref 26.0–34.0)
MCHC: 34.6 g/dL (ref 30.0–36.0)
MCV: 92.7 fL (ref 80.0–100.0)
Platelets: 137 10*3/uL — ABNORMAL LOW (ref 150–400)
RBC: 2.74 MIL/uL — ABNORMAL LOW (ref 3.87–5.11)
RDW: 13.1 % (ref 11.5–15.5)
WBC: 7.1 10*3/uL (ref 4.0–10.5)
nRBC: 0 % (ref 0.0–0.2)

## 2024-02-19 LAB — CULTURE, BETA STREP (GROUP B ONLY): Strep Gp B Culture: NEGATIVE

## 2024-02-19 MED ORDER — SUCROSE 24% NICU/PEDS ORAL SOLUTION: 0.5000 mL | OROMUCOSAL | Status: DC | PRN

## 2024-02-19 MED ORDER — VASELINE PETROLATUM GAUZE EX PADS: 1.0000 | MEDICATED_PAD | Freq: Once | CUTANEOUS | Status: DC | PRN

## 2024-02-19 MED ORDER — WHITE PETROLATUM EX OINT: 1.0000 | TOPICAL_OINTMENT | CUTANEOUS | Status: DC | PRN

## 2024-02-19 MED ORDER — FERROUS SULFATE 325 (65 FE) MG PO TABS
325.0000 mg | ORAL_TABLET | ORAL | Status: DC
  Administered 2024-02-19: 325 mg via ORAL
  Filled 2024-02-19: qty 1

## 2024-02-19 MED ORDER — EPINEPHRINE TOPICAL FOR CIRCUMCISION 0.1 MG/ML: 1.0000 [drp] | TOPICAL | Status: DC | PRN

## 2024-02-19 MED ORDER — LIDOCAINE 1% INJECTION FOR CIRCUMCISION
0.8000 mL | INJECTION | Freq: Once | INTRAVENOUS | Status: DC
  Filled 2024-02-19: qty 1

## 2024-02-19 NOTE — Progress Notes (Signed)
 CSW received consult due to score 11 on Edinburgh Depression Screen.  CSW met MOB at bedside to complete a mental health assessment and offer support. CSW entered the room, introduced herself and explained the reason for the visit. MOB was polite, easy to engage, receptive to meeting with CSW, and appeared forthcoming.  Patient requests a referral to Integrated Behavioral Health Patient verbalizes understanding that the appointment will be virtual. CSW acknowledged Edinburgh score of 11 and listened to [facility] explore her feelings about transitioning into motherhood. MOB reported having some worries with obtaining a breast pump for when she is  home. CSW encouraged MOB to continue to work alongside of her insurance to obtain breast pump and CSW will notify the hospital lactation for additional support; MOB was understanding. CSW asked MOB about her mental health history. MOB reported being diagnosed with anxiety/depression during this pregnancy (2025). MOB reported being prescribed medication (Zoloft ) during pregnancy and she will begin the regiment during this PP period for support. 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 MOB reported she did not reach out to a medical professional for support of her PPD due to her PCP changing practices. MOB reported her supports as FOB and her mom. CSW provided education regarding Baby Blues vs PMADs and provided MOB with resources for mental health follow up.  CSW encouraged MOB to evaluate her mental health throughout the postpartum period with the use of the New Mom Checklist developed by Postpartum Progress as well as the Edinburgh Postnatal Depression Scale and notify a medical professional if symptoms arise.  CSW assessed for safety with MOB SI and HI; MOB denied all. CSW did not assess for DV; FOB was present.   MOB reported having all essential items for the infant including a carseat, bassinet and  crib for safe sleeping. CSW assessed for safety with MOB SI/HI/DV;MOB denied all.  CSW identifies no further need for intervention and no barriers to discharge at this time.  Rosina Molt, [place] Clinical Social Worker [phone]

## 2024-02-19 NOTE — Progress Notes (Addendum)
 POSTPARTUM PROGRESS NOTE  Post Partum Day 1  Subjective:  Michele Fox is a 29 y.o. G3P3003 s/p SVD at [redacted]w[redacted]d.  No acute events overnight.  Pt denies problems with ambulating, voiding or po intake.  She denies nausea or vomiting.  Pain is moderately controlled.  She has had flatus. She has not had bowel movement.  Lochia small, she used 2 pads overnight, vaginal bleeding is improving.   Objective: Blood pressure 129/83, pulse 61, temperature 98.2 F (36.8 C), temperature source Oral, resp. rate 18, height 5' 2 (1.575 m), weight 121.7 kg, SpO2 100%, unknown if currently breastfeeding.  Physical Exam:  General: alert, cooperative and no distress Chest: no respiratory distress Heart:regular rate, Abdomen: soft, nontender,  Uterine Fundus: firm, appropriately tender DVT Evaluation: No calf swelling or tenderness Extremities: no edema Skin: warm, dry  Recent Labs    02/17/24 2019 02/19/24 0430  HGB 10.7* 8.8*  HCT 30.3* 25.4*    Assessment/Plan: Rosie Torrez is a 29 y.o. G3P3003 s/p SVD at [redacted]w[redacted]d   PPD#1 - Doing well. Reporting lots of cramping, which is helped some with medication.  Contraception: outpatient nexplanon Feeding: Breast PPH: Repeat CBC 8.8, starting PO Fe Dispo: Plan for discharge tomorrow.   LOS: 2 days   Manon Jester, DO, CNM 02/19/2024, 7:59 AM    I supervised the student during this patient encounter. Please see provider note for complete documentation.   Edrie Ehrich, [place] 02/19/2024 5:44 PM

## 2024-02-19 NOTE — Anesthesia Postprocedure Evaluation (Signed)
"   Anesthesia Post Note  Patient: Denina Rieger  Procedure(s) Performed: AN AD HOC LABOR EPIDURAL     Patient location during evaluation: Mother Baby Anesthesia Type: Epidural Level of consciousness: awake and alert and oriented Pain management: satisfactory to patient Vital Signs Assessment: post-procedure vital signs reviewed and stable Respiratory status: respiratory function stable Cardiovascular status: stable Postop Assessment: no headache, no backache, epidural receding, patient able to bend at knees, no signs of nausea or vomiting, adequate PO intake and able to ambulate Anesthetic complications: no   No notable events documented.  Last Vitals:  Vitals:   02/19/24 0202 02/19/24 0608  BP: 120/72 129/83  Pulse: 71 61  Resp: 18 18  Temp:    SpO2: 100% 100%    Last Pain:  Vitals:   02/19/24 1121  TempSrc:   PainSc: 10-Worst pain ever   Pain Goal:                   Barrett Goldie      "

## 2024-02-19 NOTE — Progress Notes (Signed)
 POSTPARTUM PROGRESS NOTE  Post Partum Day #1  Subjective:  Michele Fox is a 29 y.o. G3P3003 s/p NSVD at [redacted]w[redacted]d.  No acute events overnight.  Pt denies problems with ambulating, voiding or po intake.  She denies nausea or vomiting.  Pain is well controlled.  She has had flatus. She has not had bowel movement.  Lochia Minimal.   Objective: Blood pressure 129/83, pulse 61, temperature 98.2 F (36.8 C), temperature source Oral, resp. rate 18, height 5' 2 (1.575 m), weight 121.7 kg, SpO2 100%, unknown if currently breastfeeding.  Physical Exam:  General: alert, cooperative and no distress Chest: no respiratory distress Heart:regular rate, distal pulses intact Abdomen: soft, nontender,  Uterine Fundus: firm, appropriately tender DVT Evaluation: No calf swelling or tenderness Extremities: mild edema Skin: warm, dry  Recent Labs    02/17/24 2019 02/19/24 0430  HGB 10.7* 8.8*  HCT 30.3* 25.4*    Assessment/Plan: Michele Fox is a 29 y.o. G3P3003 s/p NSVD at [redacted]w[redacted]d.  PPD#1 - Doing well  1. Continue routine postpartum care.   2. Infant feeding status: breast feeding - Lactation consult PRN for breastfeeding   3. Contraception plan: Nexplanon  4. Acute blood loss anemia - clinically significant.  -Hemodynamically stable and asymptomatic -Intervention: continue on oral supplementation with ferrous sulfate  325  5. Declined Tdap and MMR (02/19/2024)  Dispo: Plan for discharge tomorrow.   LOS: 2 days   Yassin Scales, CNM, CNM 02/19/2024, 9:42 AM

## 2024-02-20 ENCOUNTER — Encounter (HOSPITAL_COMMUNITY): Payer: Self-pay | Admitting: Obstetrics and Gynecology

## 2024-02-20 MED ORDER — ACETAMINOPHEN 325 MG PO TABS: 650.0000 mg | ORAL_TABLET | ORAL | 0 refills | Status: AC | PRN

## 2024-02-20 MED ORDER — POTASSIUM CHLORIDE CRYS ER 20 MEQ PO TBCR: 20.0000 meq | EXTENDED_RELEASE_TABLET | Freq: Every day | ORAL | 0 refills | Status: AC

## 2024-02-20 MED ORDER — IBUPROFEN 600 MG PO TABS: 600.0000 mg | ORAL_TABLET | Freq: Four times a day (QID) | ORAL | 0 refills | Status: AC | PRN

## 2024-02-20 MED ORDER — FERROUS SULFATE 325 (65 FE) MG PO TABS: 325.0000 mg | ORAL_TABLET | ORAL | 0 refills | Status: AC

## 2024-02-20 MED ORDER — SENNOSIDES-DOCUSATE SODIUM 8.6-50 MG PO TABS: 2.0000 | ORAL_TABLET | Freq: Every evening | ORAL | 0 refills | Status: AC | PRN

## 2024-02-20 MED ORDER — FUROSEMIDE 20 MG PO TABS: 20.0000 mg | ORAL_TABLET | Freq: Every day | ORAL | 0 refills | Status: AC

## 2024-02-20 NOTE — Lactation Note (Signed)
 This note was copied from a baby's chart. Lactation Consultation Note  Patient Name: Michele Fox Unijb'd Date: 02/20/2024 Age:27 hours  As LC entered the room mom was pumping on breast with results and The baby was propped up with the bottle feeding and mom was eating breakfast. LC offered to assist with feeding the baby and  mom receptive.  LC checked and changed a wet and a stool diaper ( transitional ) and  worked on feeding the baby until mom was able to take over and finish the feeding.  LC reviewed engorgement prevention and tx.  LC stressed the importance of feeding with cues and by 3 hours. LC reviewed supply and demand and stressed the importance of giving the baby time at the breast too to enhance the milk supply.  Per mom has to start right back up with school so she will have to do both with dad and her mom helping.  Stork pump has been ordered and pending,  Per LC OP progress note mom declined follow up LC apt.    Maternal Data    Feeding - breast , formula and now EBM.     LATCH Score - did not latch this feeding     Lactation Tools Discussed/Used  DEBP , Hand pump   Interventions  Education   Discharge    Consult Status  Complete 02/20/2024    Michele Fox [facility] 02/20/2024, 1:02 PM

## 2024-02-20 NOTE — Lactation Note (Signed)
 This note was copied from a baby's chart. Lactation Consultation Note  Patient Name: Michele Fox Unijb'd Date: 02/20/2024 Age:27 hours Reason for consult: Initial assessment;Term  P3. Mom stated the baby is latching w/o difficulty and BF well. Mom is BF and formula feeding because she doesn't know how much the baby is getting and her milk hasn't come in yet. Mom hand expressed easily colostrum noted. Mom stated she wants to pump and give baby her BM in a bottle. Mom stated she wanting to not have to give him much formula. Mom denies painful latches or difficulty latching. Mom asked for pump so LC set up DEBP. Mom shown how to use DEBP & how to disassemble, clean, & reassemble parts. Mom encouraged to feed baby 8-12 times/24 hours and with feeding cues.  Mom pumping when LC left. Noted pumping colostrum. Praised mom. Mom stated she need pump for home. LC is sending referral for [facility] pump.  Encouraged mom to BF before giving formula. Newborn feeding habits, STS, I&O milk storage reviewed. Encouraged mom to call for assistance or questions as needed. Maternal Data Does the patient have breastfeeding experience prior to this delivery?: Yes How long did the patient breastfeed?: 1st child 4 yrs old pumped, bottle and BF 1 yr. 2nd child 1 yr old pumped 6 months  Feeding    LATCH Score       Type of Nipple: Flat (very compressible/soft)  Comfort (Breast/Nipple): Soft / non-tender         Lactation Tools Discussed/Used Tools: Pump;Flanges Flange Size: 18 Breast pump type: Double-Electric Breast Pump Pump Education: Setup, frequency, and cleaning;Milk Storage Reason for Pumping: mom wants to pump and give some BM in bottle Pumping frequency: q 3 hr/PRN  Interventions Interventions: Breast feeding basics reviewed;Breast compression;DEBP;Education;LC Services brochure  Discharge Pump: Referral sent for [facility] Pump  Consult Status Consult Status: Follow-up Date:  02/20/24 Follow-up type: In-patient    [name] 02/20/2024, 12:20 AM

## 2024-02-20 NOTE — Patient Instructions (Signed)

## 2024-02-21 ENCOUNTER — Telehealth: Payer: Self-pay | Admitting: Licensed Clinical Social Worker

## 2024-02-21 ENCOUNTER — Telehealth (HOSPITAL_COMMUNITY): Payer: Self-pay | Admitting: *Deleted

## 2024-02-21 NOTE — Telephone Encounter (Signed)
 EPDS score in hospital = 11. Answer to question ten = 0-Never. CSW consult note 02/19/2024 indicated patient wants referral for [facility]. Opened chart to place order. Dr. Erik placed [facility] order on 02/20/2024.  Mliss Sieve, RN 02/21/2024 11:10

## 2024-02-21 NOTE — Telephone Encounter (Signed)
 [facility] contacted patient on this date to schedule [facility] appointment. [facility] left a VM.

## 2024-02-26 ENCOUNTER — Telehealth (HOSPITAL_COMMUNITY): Payer: Self-pay | Admitting: *Deleted

## 2024-02-26 NOTE — Telephone Encounter (Signed)
 Attempted hospital discharge follow-up call. Message received stating, Call cannot be completed at this time. Please hang up and try your call again later. Allean IVAR Carton, RN, 02/26/24, [phone]

## 2024-03-24 ENCOUNTER — Ambulatory Visit: Payer: Self-pay | Admitting: Obstetrics and Gynecology
